# Patient Record
Sex: Female | Born: 1980 | Race: Black or African American | Hispanic: No | Smoking: Never smoker
Health system: Southern US, Community
[De-identification: ages and names within clinical notes are randomized; demographics above are authoritative.]

## PROBLEM LIST (undated history)

## (undated) DIAGNOSIS — I10 Essential (primary) hypertension: Secondary | ICD-10-CM

## (undated) DIAGNOSIS — K219 Gastro-esophageal reflux disease without esophagitis: Secondary | ICD-10-CM

## (undated) DIAGNOSIS — K8010 Calculus of gallbladder with chronic cholecystitis without obstruction: Secondary | ICD-10-CM

## (undated) DIAGNOSIS — N920 Excessive and frequent menstruation with regular cycle: Secondary | ICD-10-CM

## (undated) DIAGNOSIS — M7989 Other specified soft tissue disorders: Secondary | ICD-10-CM

## (undated) DIAGNOSIS — Z91018 Allergy to other foods: Secondary | ICD-10-CM

## (undated) DIAGNOSIS — F419 Anxiety disorder, unspecified: Secondary | ICD-10-CM

## (undated) DIAGNOSIS — M2550 Pain in unspecified joint: Secondary | ICD-10-CM

## (undated) DIAGNOSIS — R87619 Unspecified abnormal cytological findings in specimens from cervix uteri: Secondary | ICD-10-CM

## (undated) DIAGNOSIS — R51 Headache: Secondary | ICD-10-CM

## (undated) DIAGNOSIS — R519 Headache, unspecified: Secondary | ICD-10-CM

## (undated) DIAGNOSIS — R5383 Other fatigue: Secondary | ICD-10-CM

## (undated) DIAGNOSIS — T8859XA Other complications of anesthesia, initial encounter: Secondary | ICD-10-CM

## (undated) DIAGNOSIS — K5900 Constipation, unspecified: Secondary | ICD-10-CM

## (undated) DIAGNOSIS — T4145XA Adverse effect of unspecified anesthetic, initial encounter: Secondary | ICD-10-CM

## (undated) DIAGNOSIS — E559 Vitamin D deficiency, unspecified: Secondary | ICD-10-CM

## (undated) DIAGNOSIS — D696 Thrombocytopenia, unspecified: Secondary | ICD-10-CM

## (undated) DIAGNOSIS — D649 Anemia, unspecified: Secondary | ICD-10-CM

## (undated) DIAGNOSIS — M25569 Pain in unspecified knee: Secondary | ICD-10-CM

## (undated) DIAGNOSIS — J45909 Unspecified asthma, uncomplicated: Secondary | ICD-10-CM

## (undated) HISTORY — DX: Thrombocytopenia, unspecified: D69.6

## (undated) HISTORY — DX: Other fatigue: R53.83

## (undated) HISTORY — DX: Allergy to other foods: Z91.018

## (undated) HISTORY — DX: Other specified soft tissue disorders: M79.89

## (undated) HISTORY — DX: Anxiety disorder, unspecified: F41.9

## (undated) HISTORY — DX: Unspecified asthma, uncomplicated: J45.909

## (undated) HISTORY — DX: Excessive and frequent menstruation with regular cycle: N92.0

## (undated) HISTORY — PX: FOOT SURGERY: SHX648

## (undated) HISTORY — DX: Unspecified abnormal cytological findings in specimens from cervix uteri: R87.619

## (undated) HISTORY — DX: Essential (primary) hypertension: I10

## (undated) HISTORY — DX: Constipation, unspecified: K59.00

## (undated) HISTORY — DX: Pain in unspecified knee: M25.569

## (undated) HISTORY — DX: Anemia, unspecified: D64.9

## (undated) HISTORY — DX: Vitamin D deficiency, unspecified: E55.9

## (undated) HISTORY — DX: Pain in unspecified joint: M25.50

---

## 2001-06-11 HISTORY — PX: BREAST SURGERY: SHX581

## 2003-06-12 DIAGNOSIS — D696 Thrombocytopenia, unspecified: Secondary | ICD-10-CM

## 2003-06-12 HISTORY — DX: Thrombocytopenia, unspecified: D69.6

## 2008-06-11 HISTORY — PX: TUBAL LIGATION: SHX77

## 2011-07-11 ENCOUNTER — Institutional Professional Consult (permissible substitution): Payer: Self-pay | Admitting: Cardiology

## 2011-07-24 ENCOUNTER — Telehealth: Payer: Self-pay | Admitting: Hematology & Oncology

## 2011-07-24 NOTE — Telephone Encounter (Signed)
Pt aware of 2-28 appointment faxed letter to referrring

## 2011-08-09 ENCOUNTER — Ambulatory Visit: Payer: Self-pay | Admitting: Hematology & Oncology

## 2011-08-09 ENCOUNTER — Other Ambulatory Visit: Payer: Self-pay | Admitting: Lab

## 2011-08-09 ENCOUNTER — Ambulatory Visit: Payer: Self-pay

## 2011-08-09 ENCOUNTER — Telehealth: Payer: Self-pay | Admitting: Hematology & Oncology

## 2011-08-09 NOTE — Telephone Encounter (Signed)
Pt cx 2-28 reschedule for 4-1 said she had a sick child.

## 2011-09-10 ENCOUNTER — Other Ambulatory Visit: Payer: Self-pay | Admitting: Lab

## 2011-09-10 ENCOUNTER — Encounter: Payer: Self-pay | Admitting: Hematology & Oncology

## 2011-09-10 ENCOUNTER — Telehealth: Payer: Self-pay | Admitting: Hematology & Oncology

## 2011-09-10 ENCOUNTER — Ambulatory Visit: Payer: Self-pay

## 2011-09-10 NOTE — Telephone Encounter (Signed)
Amy at referring is aware pt was a no show for the 2nd time. Per Dr. Myna Hidalgo do not reschedule patient.

## 2012-10-20 ENCOUNTER — Encounter: Payer: Self-pay | Admitting: *Deleted

## 2012-10-21 ENCOUNTER — Encounter: Payer: Self-pay | Admitting: Nurse Practitioner

## 2012-10-21 ENCOUNTER — Ambulatory Visit (INDEPENDENT_AMBULATORY_CARE_PROVIDER_SITE_OTHER): Payer: BC Managed Care – PPO | Admitting: Nurse Practitioner

## 2012-10-21 VITALS — BP 130/68 | HR 76 | Resp 14 | Ht 64.5 in | Wt 245.8 lb

## 2012-10-21 DIAGNOSIS — Z0000 Encounter for general adult medical examination without abnormal findings: Secondary | ICD-10-CM

## 2012-10-21 DIAGNOSIS — Z01419 Encounter for gynecological examination (general) (routine) without abnormal findings: Secondary | ICD-10-CM

## 2012-10-21 LAB — POCT URINALYSIS DIPSTICK
Spec Grav, UA: 1.01
Urobilinogen, UA: NEGATIVE

## 2012-10-21 NOTE — Patient Instructions (Signed)

## 2012-10-21 NOTE — Progress Notes (Signed)
32 y.o. G63P4 Married African American Fe here for annual exam.  Menses have been regular except for last month. PMP 08/30/12 and then no menses unit 10/10/12 with an increase in PMS with food craving, breast tenderness and bloating.  States she has been having menstrual type pain about 2 wk's prior to cycle. Then again cramps 1 week prior to cycle.  This past cycle which was late started with watery red then bright red bleeding, but only lasted for 3 days.  Usually last 3-4 days and darker in color.  Some pelvic with sexual activity if close to cycle. Also noted with this last cycle a pelvic pressure and  'shifting'  that lasted for a week. She had PUS 06/27/11.  History of Thrombocytopenia and was to see Dr. Blenda Peals and did not show 09/2011.  Patient's last menstrual period was 10/10/2012.          Sexually active: yes  The current method of family planning is BTL.   (OCP causes increase in Headaches) Exercising: yes  cardio Smoker:  no  Health Maintenance: Pap:  05/29/2011  Normal  MMG: never TDaP:  unknown Labs: Hgb-12.9   does not have a smoking history on file. She has never used smokeless tobacco. She reports that she does not use illicit drugs.  Past Medical History  Diagnosis Date  . Menorrhagia   . Anemia   . Thrombocytopenia     Past Surgical History  Procedure Laterality Date  . Foot surgery Left     correct arch  . Breast surgery      breast reduction   . Tubal ligation Bilateral     Current Outpatient Prescriptions  Medication Sig Dispense Refill  . Multiple Vitamins-Minerals (MULTIVITAMIN PO) Take by mouth daily.      . ferrous fumarate (FERRO-SEQUELS) 50 MG CR tablet Take 50 mg by mouth daily.       No current facility-administered medications for this visit.    History reviewed. No pertinent family history.  ROS:  Pertinent items are noted in HPI.  Otherwise, a comprehensive ROS was negative.  Exam:   BP 130/68  Pulse 76  Resp 14  Ht 5' 4.5" (1.638 m)  Wt  245 lb 12.8 oz (111.494 kg)  BMI 41.56 kg/m2  LMP 10/10/2012 Height: 5' 4.5" (163.8 cm)  Ht Readings from Last 3 Encounters:  10/21/12 5' 4.5" (1.638 m)    General appearance: alert, cooperative and appears stated age Head: Normocephalic, without obvious abnormality, atraumatic Neck: no adenopathy, supple, symmetrical, trachea midline and thyroid normal to inspection and palpation Lungs: clear to auscultation bilaterally Breasts: normal appearance, no masses or tenderness Post breast reductions. Heart: regular rate and rhythm Abdomen: soft, non-tender; no masses,  no organomegaly Extremities: extremities normal, atraumatic, no cyanosis or edema Skin: Skin color, texture, turgor normal. No rashes or lesions Lymph nodes: Cervical, supraclavicular, and axillary nodes normal. No abnormal inguinal nodes palpated Neurologic: Grossly normal   Pelvic: External genitalia:  no lesions              Urethra:  normal appearing urethra with no masses, tenderness or lesions              Bartholin's and Skene's: normal                 Vagina: normal appearing vagina with normal color and discharge, no lesions              Cervix: anteverted  Pap taken: yes plus HR HPV Bimanual Exam:  Uterus:  normal size, contour, position, consistency, mobility, non-tender              Adnexa: no mass, fullness, tenderness               Rectovaginal: Confirms               Anus:  normal sphincter tone, no lesions  A:  Well Woman with normal exam  S/P BTL  History of thrombocytopenia (failed to see Onc 4/13)  History of anemia secondary to menorrhagia  P:   Pap smear as per guidelines   Will discuss her symptoms with Dr. Hyacinth Meeker who did her last PUS.  Most likely conservative management and menses record.  return annually or prn  An After Visit Summary was printed and given to the patient.  Reviewed note.  Agree with above plan.  Lum Keas, MD

## 2012-10-23 LAB — HEMOGLOBIN, FINGERSTICK: Hemoglobin, fingerstick: 12.9 g/dL (ref 12.0–16.0)

## 2012-10-23 LAB — IPS PAP TEST WITH HPV

## 2013-02-12 NOTE — Progress Notes (Signed)
This encounter was created in error - please disregard.

## 2014-04-12 ENCOUNTER — Encounter: Payer: Self-pay | Admitting: Nurse Practitioner

## 2015-01-19 ENCOUNTER — Encounter: Payer: Self-pay | Admitting: Nurse Practitioner

## 2015-05-27 ENCOUNTER — Ambulatory Visit (INDEPENDENT_AMBULATORY_CARE_PROVIDER_SITE_OTHER): Payer: BLUE CROSS/BLUE SHIELD | Admitting: Nurse Practitioner

## 2015-05-27 ENCOUNTER — Encounter: Payer: Self-pay | Admitting: Nurse Practitioner

## 2015-05-27 VITALS — BP 120/76 | HR 68 | Resp 16 | Ht 64.5 in | Wt 215.0 lb

## 2015-05-27 DIAGNOSIS — K5900 Constipation, unspecified: Secondary | ICD-10-CM | POA: Diagnosis not present

## 2015-05-27 DIAGNOSIS — Z23 Encounter for immunization: Secondary | ICD-10-CM | POA: Diagnosis not present

## 2015-05-27 DIAGNOSIS — Z0000 Encounter for general adult medical examination without abnormal findings: Secondary | ICD-10-CM | POA: Diagnosis not present

## 2015-05-27 DIAGNOSIS — Z01419 Encounter for gynecological examination (general) (routine) without abnormal findings: Secondary | ICD-10-CM | POA: Diagnosis not present

## 2015-05-27 DIAGNOSIS — N898 Other specified noninflammatory disorders of vagina: Secondary | ICD-10-CM

## 2015-05-27 DIAGNOSIS — Z113 Encounter for screening for infections with a predominantly sexual mode of transmission: Secondary | ICD-10-CM | POA: Diagnosis not present

## 2015-05-27 DIAGNOSIS — E559 Vitamin D deficiency, unspecified: Secondary | ICD-10-CM

## 2015-05-27 DIAGNOSIS — R87619 Unspecified abnormal cytological findings in specimens from cervix uteri: Secondary | ICD-10-CM

## 2015-05-27 NOTE — Patient Instructions (Signed)

## 2015-05-27 NOTE — Progress Notes (Signed)
34 y.o. G64P4 Married  African American Fe here for annual exam.  Menses now at 3 -4 days.  Heavy for 2 days.  Changing every few hours.  Seems  to occur every other month but exercise makes it more tolerable.  Some cramps since BTL.  Last PUS about 2013 showing small myomas and normal endo biopsy.  She is having more problems with constipation despite using Miralax prn.  Patient's last menstrual period was 05/21/2015 (exact date).          Sexually active: Yes.    The current method of family planning is tubal ligation.    Exercising: Yes.    Walking Smoker:  no  Health Maintenance: Pap: 10/21/2012 Neg HR HPV Neg TDaP:  Not sure  Labs: will get here   reports that she has never smoked. She has never used smokeless tobacco. She reports that she drinks alcohol. She reports that she does not use illicit drugs.  Past Medical History  Diagnosis Date  . Menorrhagia   . Anemia   . Thrombocytopenia (HCC) 2005    Past Surgical History  Procedure Laterality Date  . Foot surgery Left child 4th grade    correct arch  . Tubal ligation Bilateral 2010  . Breast surgery  2003    breast reduction     Current Outpatient Prescriptions  Medication Sig Dispense Refill  . ferrous fumarate (FERRO-SEQUELS) 50 MG CR tablet Take 50 mg by mouth daily.    . Multiple Vitamins-Minerals (MULTIVITAMIN PO) Take by mouth daily.     No current facility-administered medications for this visit.    Family History  Problem Relation Age of Onset  . Hyperlipidemia Mother   . Hypertension Mother   . Diabetes Mother   . Pancreatic cancer Father   . Diverticulitis Mother     ROS:  Pertinent items are noted in HPI.  Otherwise, a comprehensive ROS was negative.  Exam:   BP 120/76 mmHg  Pulse 68  Resp 16  Ht 5' 4.5" (1.638 m)  Wt 215 lb (97.523 kg)  BMI 36.35 kg/m2  LMP 05/21/2015 (Exact Date) Height: 5' 4.5" (163.8 cm) Ht Readings from Last 3 Encounters:  05/27/15 5' 4.5" (1.638 m)  10/21/12 5' 4.5"  (1.638 m)    General appearance: alert, cooperative and appears stated age Head: Normocephalic, without obvious abnormality, atraumatic Neck: no adenopathy, supple, symmetrical, trachea midline and thyroid normal to inspection and palpation Lungs: clear to auscultation bilaterally Breasts: normal appearance, no masses or tenderness Heart: regular rate and rhythm Abdomen: soft, non-tender; no masses,  no organomegaly Extremities: extremities normal, atraumatic, no cyanosis or edema Skin: Skin color, texture, turgor normal. No rashes or lesions Lymph nodes: Cervical, supraclavicular, and axillary nodes normal. No abnormal inguinal nodes palpated Neurologic: Grossly normal   Pelvic: External genitalia:  no lesions              Urethra:  normal appearing urethra with no masses, tenderness or lesions              Bartholin's and Skene's: normal                 Vagina: normal appearing vagina with normal color and some brown tint vaginal discharge that may be from recent menses.  No lesions              Cervix: anteverted              Pap taken: Yes.   Bimanual Exam:  Uterus:  normal size, contour, position, consistency, mobility, non-tender              Adnexa: no mass, fullness, tenderness               Rectovaginal: Confirms               Anus:  normal sphincter tone, no lesions  Chaperone present: yes  A:  Well Woman with normal exam  S/P BTL 2010 with some menorrhagia History of thrombocytopenia (failed to see Onc 4/13) History of anemia secondary to menorrhagia  Immunization update  Chronic constipation - wants to see GI  R/O vaginitis   P:   Reviewed health and wellness pertinent to exam  Pap smear as above  Will get labs and follow - she is currently off Shannan HarperFerro Sequel  Talked about that she may need to see Oncologist again  Will  update TDaP  Counseled on breast self exam, mammography screening, adequate intake of calcium and vitamin D, diet and  exercise return annually or prn  An After Visit Summary was printed and given to the patient.

## 2015-05-28 LAB — CBC WITH DIFFERENTIAL/PLATELET
BASOS ABS: 0 10*3/uL (ref 0.0–0.1)
Basophils Relative: 0 % (ref 0–1)
Eosinophils Absolute: 0.1 10*3/uL (ref 0.0–0.7)
Eosinophils Relative: 2 % (ref 0–5)
HEMATOCRIT: 36.7 % (ref 36.0–46.0)
Hemoglobin: 12.7 g/dL (ref 12.0–15.0)
LYMPHS ABS: 1.5 10*3/uL (ref 0.7–4.0)
LYMPHS PCT: 33 % (ref 12–46)
MCH: 30.8 pg (ref 26.0–34.0)
MCHC: 34.6 g/dL (ref 30.0–36.0)
MCV: 89.1 fL (ref 78.0–100.0)
MPV: 11.9 fL (ref 8.6–12.4)
Monocytes Absolute: 0.4 10*3/uL (ref 0.1–1.0)
Monocytes Relative: 8 % (ref 3–12)
NEUTROS ABS: 2.6 10*3/uL (ref 1.7–7.7)
NEUTROS PCT: 57 % (ref 43–77)
Platelets: 173 10*3/uL (ref 150–400)
RBC: 4.12 MIL/uL (ref 3.87–5.11)
RDW: 13.8 % (ref 11.5–15.5)
WBC: 4.5 10*3/uL (ref 4.0–10.5)

## 2015-05-28 LAB — COMPREHENSIVE METABOLIC PANEL
ALK PHOS: 43 U/L (ref 33–115)
ALT: 13 U/L (ref 6–29)
AST: 20 U/L (ref 10–30)
Albumin: 4 g/dL (ref 3.6–5.1)
BILIRUBIN TOTAL: 0.4 mg/dL (ref 0.2–1.2)
BUN: 12 mg/dL (ref 7–25)
CO2: 28 mmol/L (ref 20–31)
Calcium: 8.8 mg/dL (ref 8.6–10.2)
Chloride: 102 mmol/L (ref 98–110)
Creat: 0.97 mg/dL (ref 0.50–1.10)
GLUCOSE: 85 mg/dL (ref 65–99)
POTASSIUM: 3.8 mmol/L (ref 3.5–5.3)
Sodium: 141 mmol/L (ref 135–146)
Total Protein: 6.6 g/dL (ref 6.1–8.1)

## 2015-05-28 LAB — STD PANEL
HEP B S AG: NEGATIVE
HIV: NONREACTIVE

## 2015-05-28 LAB — WET PREP BY MOLECULAR PROBE
CANDIDA SPECIES: NEGATIVE
Gardnerella vaginalis: POSITIVE — AB
Trichomonas vaginosis: NEGATIVE

## 2015-05-28 LAB — VITAMIN D 25 HYDROXY (VIT D DEFICIENCY, FRACTURES): Vit D, 25-Hydroxy: 33 ng/mL (ref 30–100)

## 2015-05-30 ENCOUNTER — Other Ambulatory Visit: Payer: Self-pay | Admitting: Nurse Practitioner

## 2015-05-30 MED ORDER — METRONIDAZOLE 0.75 % VA GEL: 1.0000 | Freq: Every day | VAGINAL | Status: DC

## 2015-05-30 NOTE — Progress Notes (Signed)
Encounter reviewed Cynde Menard, MD   

## 2015-05-31 LAB — IPS N GONORRHOEA AND CHLAMYDIA BY PCR

## 2015-06-01 LAB — IPS PAP TEST WITH HPV

## 2015-06-01 NOTE — Addendum Note (Signed)
Addended by: Ria CommentGRUBB, Makaveli Hoard R on: 06/01/2015 01:22 PM   Modules accepted: Orders

## 2015-06-03 LAB — IPS HPV GENOTYPING 16/18

## 2015-06-07 ENCOUNTER — Telehealth: Payer: Self-pay | Admitting: Emergency Medicine

## 2015-06-07 NOTE — Telephone Encounter (Signed)
-----   Message from Ria CommentPatricia Grubb, FNP sent at 06/05/2015  9:42 PM EST ----- Please make pt aware of normal pap with + HR HPV.  The genotype for # 16 & 18 was negative.  It is important for her to get a repeat pap in a year. 08 recall.  The BV found on pap has already been treated along with other labs reviewed.

## 2015-06-08 NOTE — Telephone Encounter (Signed)
Patient made aware of results of normal pap smear with positive HR HPV, but negative 16/18 subtype testing. Discussed HPV infection/transmission and prevention and plan of care going forward for patient. Patient voices clear understanding of results.  She is offered consult appointment with provider and she declines at this time.  She verbalized understanding of importance of follow up pap smear in one year.  08 Recall entered. Patient does not have annual exam scheduled.  Routing to provider for final review. Patient agreeable to disposition. Will close encounter.

## 2015-06-10 ENCOUNTER — Other Ambulatory Visit: Payer: Self-pay | Admitting: Gastroenterology

## 2015-06-10 DIAGNOSIS — R1013 Epigastric pain: Secondary | ICD-10-CM

## 2015-06-10 DIAGNOSIS — R110 Nausea: Secondary | ICD-10-CM

## 2015-07-04 ENCOUNTER — Encounter (HOSPITAL_COMMUNITY): Payer: BLUE CROSS/BLUE SHIELD

## 2015-07-04 ENCOUNTER — Ambulatory Visit (HOSPITAL_COMMUNITY)
Admission: RE | Admit: 2015-07-04 | Discharge: 2015-07-04 | Disposition: A | Discharge status: Home / Self Care | Payer: BLUE CROSS/BLUE SHIELD | Source: Ambulatory Visit | Attending: Gastroenterology | Admitting: Gastroenterology

## 2015-07-04 DIAGNOSIS — R1013 Epigastric pain: Secondary | ICD-10-CM | POA: Insufficient documentation

## 2015-07-04 DIAGNOSIS — R110 Nausea: Secondary | ICD-10-CM | POA: Diagnosis not present

## 2015-07-04 DIAGNOSIS — K8020 Calculus of gallbladder without cholecystitis without obstruction: Secondary | ICD-10-CM | POA: Insufficient documentation

## 2015-07-25 ENCOUNTER — Ambulatory Visit: Payer: Self-pay | Admitting: Surgery

## 2015-07-25 NOTE — H&P (Signed)
History of Present Illness Julia Schultz. Julia Strebel MD; 07/25/2015 12:39 PM) Patient words: GB.  The patient is a 35 year old female who presents for evaluation of gall stones. Referred by Dr. Charna Schultz for gallbladder disease  This is a 35 year old female who presents with several years of intermittent digestive symptoms. She reports frequent constipation. She also reports postprandial bloating, cramping, as well as nausea. She developed abdominal pain in her epigastrium and right upper quadrant. When the symptoms get worse the pain spreads across to her left. Her family has noticed that this happens every time she tries to eat. She recently saw Dr. Loreta Schultz for constipation. Ultrasound and labs were obtained. Liver function tests were normal. Ultrasound showed multiple gallstones  CLINICAL DATA: Nausea, epigastric abdominal pain for 5 years  EXAM: ABDOMEN ULTRASOUND COMPLETE  COMPARISON: None  FINDINGS: Gallbladder: Multiple mobile shadowing calculi within gallbladder up to 13 mm diameter. Normal gallbladder wall thickness. No pericholecystic fluid or sonographic Murphy sign.  Common bile duct: Diameter: Normal caliber 3 mm diameter.  Liver: Normal appearance  IVC: Normal appearance  Pancreas: Normal appearance  Spleen: Normal appearance, 6.4 cm length  Right Kidney: Length: 10.3 cm. Normal morphology without mass or hydronephrosis.  Left Kidney: Length: 11.9 cm. Normal morphology without mass or hydronephrosis.  Abdominal aorta: Normal caliber  Other findings: No free fluid  IMPRESSION: Cholelithiasis without evidence acute cholecystitis or biliary obstruction.   Electronically Signed By: Julia Schultz M.D. On: 07/04/2015 09:18     Other Problems (Julia Eversole, LPN; 0/98/1191 47:82 AM) Cholelithiasis Hemorrhoids  Past Surgical History (Julia Eversole, LPN; 9/56/2130 86:57 AM) Cesarean Section - Multiple Foot Surgery Left. Mammoplasty; Reduction  Bilateral.  Diagnostic Studies History (Julia Eversole, LPN; 8/46/9629 52:84 AM) Colonoscopy never Mammogram never Pap Smear 1-5 years ago  Allergies (Julia Eversole, LPN; 1/32/4401 02:72 AM) No Known Drug Allergies 07/25/2015  Medication History (Julia Eversole, LPN; 5/36/6440 34:74 AM) Multiple Vitamin (Oral) Active. Medications Reconciled  Social History (Julia Eversole, LPN; 2/59/5638 75:64 AM) Alcohol use Occasional alcohol use. Caffeine use Coffee, Tea. No drug use Tobacco use Never smoker.  Family History Julia Pilling, LPN; 3/32/9518 84:16 AM) Bleeding disorder Mother. Diabetes Mellitus Mother. Hypertension Father, Mother. Ischemic Bowel Disease Mother. Malignant Neoplasm Of Pancreas Father.  Pregnancy / Birth History Julia Pilling, LPN; 11/15/3014 01:09 AM) Julia Schultz 4 Maternal age 2-20 Para 4 Regular periods     Review of Systems (Julia Eversole LPN; 09/01/5571 22:02 AM) General Present- Fatigue. Not Present- Appetite Loss, Chills, Fever, Night Sweats, Weight Gain and Weight Loss. Skin Not Present- Change in Wart/Mole, Dryness, Hives, Jaundice, New Lesions, Non-Healing Wounds, Rash and Ulcer. HEENT Present- Seasonal Allergies and Wears glasses/contact lenses. Not Present- Earache, Hearing Loss, Hoarseness, Nose Bleed, Oral Ulcers, Ringing in the Ears, Sinus Pain, Sore Throat, Visual Disturbances and Yellow Eyes. Respiratory Present- Snoring. Not Present- Bloody sputum, Chronic Cough, Difficulty Breathing and Wheezing. Breast Not Present- Breast Mass, Breast Pain, Nipple Discharge and Skin Changes. Cardiovascular Present- Leg Cramps. Not Present- Chest Pain, Difficulty Breathing Lying Down, Palpitations, Rapid Heart Rate, Shortness of Breath and Swelling of Extremities. Gastrointestinal Present- Abdominal Pain, Bloating, Constipation, Gets full quickly at meals, Hemorrhoids, Indigestion and Nausea. Not Present- Bloody Stool, Change in Bowel  Habits, Chronic diarrhea, Difficulty Swallowing, Excessive gas, Rectal Pain and Vomiting. Female Genitourinary Not Present- Frequency, Nocturia, Painful Urination, Pelvic Pain and Urgency. Musculoskeletal Not Present- Back Pain, Joint Pain, Joint Stiffness, Muscle Pain, Muscle Weakness and Swelling of Extremities. Neurological Not Present- Decreased Memory, Fainting, Headaches,  Numbness, Seizures, Tingling, Tremor, Trouble walking and Weakness. Psychiatric Not Present- Anxiety, Bipolar, Change in Sleep Pattern, Depression, Fearful and Frequent crying. Endocrine Not Present- Cold Intolerance, Excessive Hunger, Hair Changes, Heat Intolerance, Hot flashes and New Diabetes. Hematology Present- Easy Bruising and Excessive bleeding. Not Present- Gland problems, HIV and Persistent Infections.  Vitals (Julia Eversole LPN; 1/61/0960 45:40 AM) 07/25/2015 10:04 AM Weight: 215.2 lb Height: 63in Body Surface Area: 1.99 m Body Mass Index: 38.12 kg/m  Temp.: 98.89F(Oral)  Pulse: 70 (Regular)  BP: 116/70 (Sitting, Left Arm, Standard)      Physical Exam Julia Hazard K. Chrishelle Zito MD; 07/25/2015 12:40 PM)  The physical exam findings are as follows: Note:WDWN in NAD HEENT: EOMI, sclera anicteric Neck: No masses, no thyromegaly Lungs: CTA bilaterally; normal respiratory effort CV: Regular rate and rhythm; no murmurs Abd: +bowel sounds, soft, mildly tender in RUQ and epigastrium; no palpable masses Ext: Well-perfused; no edema Skin: Warm, dry; no sign of jaundice    Assessment & Plan Julia Hazard K. Shwanda Soltis MD; 07/25/2015 10:40 AM)  CHRONIC CHOLECYSTITIS WITH CALCULUS (K80.10)  Current Plans Schedule for Surgery - Laparoscopic cholecystectomy with intraoperative cholangiogram. The surgical procedure has been discussed with the patient. Potential risks, benefits, alternative treatments, and expected outcomes have been explained. All of the patient's questions at this time have been answered. The  likelihood of reaching the patient's treatment goal is good. The patient understand the proposed surgical procedure and wishes to proceed. Pt Education - Pamphlet Given - Laparoscopic Gallbladder Surgery: discussed with patient and provided information.  Julia Schultz. [name], MD, [facility]  General/ Trauma Surgery  07/25/2015 12:41 PM

## 2015-08-05 NOTE — Pre-Procedure Instructions (Signed)
Julia Schultz  08/05/2015      [facility] [address], [place] - [address] AT [facility] [address]) [address] [place] [address] Phone: [phone] Fax: [phone]    Your procedure is scheduled on Wednesday, August 10, 2015  Report to [facility] Admitting at 6:30 A.M.  Call this number if you have problems the morning of surgery:  [phone]   Remember:  Do not eat food or drink liquids after midnight Tuesday, August 09, 2015  Take these medicines the morning of surgery with A SIP OF WATER : None  Stop taking Aspirin, vitamins, fish oil, pseudoephedrine (SUDAFED),  and herbal medications. Do not take any NSAIDs ie: Ibuprofen, Advil, Naproxen or any medication containing Aspirin; stop now.   Do not wear jewelry, make-up or nail polish.  Do not wear lotions, powders, or perfumes.  You may not wear deodorant.  Do not shave 48 hours prior to surgery.    Do not bring valuables to the hospital.  [facility] is not responsible for any belongings or valuables.  Contacts, dentures or bridgework may not be worn into surgery.  Leave your suitcase in the car.  After surgery it may be brought to your room.  For patients admitted to the hospital, discharge time will be determined by your treatment team.  Patients discharged the day of surgery will not be allowed to drive home.   Name and phone number of your driver:   Special instructions: [facility] - Preparing for Surgery  Before surgery, you can play an important role.  Because skin is not sterile, your skin needs to be as free of germs as possible.  You can reduce the number of germs on you skin by washing with CHG (chlorahexidine gluconate) soap before surgery.  CHG is an antiseptic cleaner which kills germs and bonds with the skin to continue killing germs even after washing.  Please DO NOT use if you have an allergy to CHG or antibacterial soaps.  If  your skin becomes reddened/irritated stop using the CHG and inform your nurse when you arrive at Short Stay.  Do not shave (including legs and underarms) for at least 48 hours prior to the first CHG shower.  You may shave your face.  Please follow these instructions carefully:   1.  Shower with CHG Soap the night before surgery and the morning of Surgery.  2.  If you choose to wash your hair, wash your hair first as usual with your normal shampoo.  3.  After you shampoo, rinse your hair and body thoroughly to remove the Shampoo.  4.  Use CHG as you would any other liquid soap.  You can apply chg directly  to the skin and wash gently with scrungie or a clean washcloth.  5.  Apply the CHG Soap to your body ONLY FROM THE NECK DOWN.  Do not use on open wounds or open sores.  Avoid contact with your eyes, ears, mouth and genitals (private parts).  Wash genitals (private parts) with your normal soap.  6.  Wash thoroughly, paying special attention to the area where your surgery will be performed.  7.  Thoroughly rinse your body with warm water from the neck down.  8.  DO NOT shower/wash with your normal soap after using and rinsing off the CHG Soap.  9.  Pat yourself dry with a clean towel.  10.  Wear clean pajamas.            11.  Place clean sheets on your bed the night of your first shower and do not sleep with pets.  Day of Surgery  Do not apply any lotions/deodorants the morning of surgery.  Please wear clean clothes to the hospital/surgery center.  Please read over the following fact sheets that you were given. Pain Booklet, Coughing and Deep Breathing and Surgical Site Infection Prevention

## 2015-08-08 ENCOUNTER — Encounter (HOSPITAL_COMMUNITY)
Admission: RE | Admit: 2015-08-08 | Discharge: 2015-08-08 | Disposition: A | Discharge status: Home / Self Care | Payer: BLUE CROSS/BLUE SHIELD | Source: Ambulatory Visit | Attending: Surgery | Admitting: Surgery

## 2015-08-08 ENCOUNTER — Encounter (HOSPITAL_COMMUNITY): Payer: Self-pay

## 2015-08-08 DIAGNOSIS — K8010 Calculus of gallbladder with chronic cholecystitis without obstruction: Secondary | ICD-10-CM | POA: Diagnosis not present

## 2015-08-08 HISTORY — DX: Headache: R51

## 2015-08-08 HISTORY — DX: Other complications of anesthesia, initial encounter: T88.59XA

## 2015-08-08 HISTORY — DX: Gastro-esophageal reflux disease without esophagitis: K21.9

## 2015-08-08 HISTORY — DX: Calculus of gallbladder with chronic cholecystitis without obstruction: K80.10

## 2015-08-08 HISTORY — DX: Headache, unspecified: R51.9

## 2015-08-08 HISTORY — DX: Adverse effect of unspecified anesthetic, initial encounter: T41.45XA

## 2015-08-08 LAB — CBC
HCT: 39.5 % (ref 36.0–46.0)
HEMOGLOBIN: 13 g/dL (ref 12.0–15.0)
MCH: 30 pg (ref 26.0–34.0)
MCHC: 32.9 g/dL (ref 30.0–36.0)
MCV: 91 fL (ref 78.0–100.0)
Platelets: 141 10*3/uL — ABNORMAL LOW (ref 150–400)
RBC: 4.34 MIL/uL (ref 3.87–5.11)
RDW: 12.8 % (ref 11.5–15.5)
WBC: 3.9 10*3/uL — AB (ref 4.0–10.5)

## 2015-08-08 LAB — HCG, SERUM, QUALITATIVE: PREG SERUM: NEGATIVE

## 2015-08-08 NOTE — Progress Notes (Signed)
Pt denies SOB, chest pain, and being under the care of a cardiologist. Pt denies having a stress test, echo and cardiac cath. Pt denies having a chest x ray and EKG within the last year. 

## 2015-08-09 MED ORDER — DEXTROSE 5 % IV SOLN
3.0000 g | INTRAVENOUS | Status: AC
  Administered 2015-08-10: 3 g via INTRAVENOUS
  Filled 2015-08-09: qty 3000

## 2015-08-10 ENCOUNTER — Encounter (HOSPITAL_COMMUNITY): Payer: Self-pay | Admitting: *Deleted

## 2015-08-10 ENCOUNTER — Ambulatory Visit (HOSPITAL_COMMUNITY): Payer: BLUE CROSS/BLUE SHIELD

## 2015-08-10 ENCOUNTER — Ambulatory Visit (HOSPITAL_COMMUNITY): Payer: BLUE CROSS/BLUE SHIELD | Admitting: Anesthesiology

## 2015-08-10 ENCOUNTER — Ambulatory Visit (HOSPITAL_COMMUNITY)
Admission: RE | Admit: 2015-08-10 | Discharge: 2015-08-10 | Disposition: A | Discharge status: Home / Self Care | Payer: BLUE CROSS/BLUE SHIELD | Source: Ambulatory Visit | Attending: Surgery | Admitting: Surgery

## 2015-08-10 ENCOUNTER — Encounter (HOSPITAL_COMMUNITY)
Admission: RE | Disposition: A | Discharge status: Home / Self Care | Payer: Self-pay | Source: Ambulatory Visit | Attending: Surgery

## 2015-08-10 DIAGNOSIS — K8010 Calculus of gallbladder with chronic cholecystitis without obstruction: Secondary | ICD-10-CM | POA: Diagnosis not present

## 2015-08-10 DIAGNOSIS — K829 Disease of gallbladder, unspecified: Secondary | ICD-10-CM

## 2015-08-10 HISTORY — PX: CHOLECYSTECTOMY: SHX55

## 2015-08-10 SURGERY — LAPAROSCOPIC CHOLECYSTECTOMY WITH INTRAOPERATIVE CHOLANGIOGRAM
Anesthesia: General | Site: Abdomen

## 2015-08-10 MED ORDER — LIDOCAINE HCL (CARDIAC) 20 MG/ML IV SOLN
INTRAVENOUS | Status: DC | PRN
  Administered 2015-08-10: 60 mg via INTRAVENOUS

## 2015-08-10 MED ORDER — PROMETHAZINE HCL 25 MG/ML IJ SOLN: 6.2500 mg | INTRAMUSCULAR | Status: DC | PRN

## 2015-08-10 MED ORDER — FENTANYL CITRATE (PF) 250 MCG/5ML IJ SOLN
INTRAMUSCULAR | Status: AC
  Filled 2015-08-10: qty 5

## 2015-08-10 MED ORDER — FENTANYL CITRATE (PF) 100 MCG/2ML IJ SOLN
INTRAMUSCULAR | Status: DC | PRN
  Administered 2015-08-10: 150 ug via INTRAVENOUS
  Administered 2015-08-10 (×2): 50 ug via INTRAVENOUS

## 2015-08-10 MED ORDER — 0.9 % SODIUM CHLORIDE (POUR BTL) OPTIME
TOPICAL | Status: DC | PRN
  Administered 2015-08-10: 1000 mL

## 2015-08-10 MED ORDER — OXYCODONE-ACETAMINOPHEN 5-325 MG PO TABS
1.0000 | ORAL_TABLET | ORAL | Status: DC | PRN
  Administered 2015-08-10: 1 via ORAL

## 2015-08-10 MED ORDER — DEXAMETHASONE SODIUM PHOSPHATE 4 MG/ML IJ SOLN
INTRAMUSCULAR | Status: DC | PRN
  Administered 2015-08-10: 8 mg via INTRAVENOUS

## 2015-08-10 MED ORDER — SODIUM CHLORIDE 0.9 % IR SOLN
Status: DC | PRN
  Administered 2015-08-10: 1000 mL

## 2015-08-10 MED ORDER — HYDROMORPHONE HCL 1 MG/ML IJ SOLN
0.2500 mg | INTRAMUSCULAR | Status: DC | PRN
  Administered 2015-08-10 (×2): 0.5 mg via INTRAVENOUS

## 2015-08-10 MED ORDER — MORPHINE SULFATE (PF) 2 MG/ML IV SOLN: 2.0000 mg | INTRAVENOUS | Status: DC | PRN

## 2015-08-10 MED ORDER — PROPOFOL 10 MG/ML IV BOLUS
INTRAVENOUS | Status: AC
  Filled 2015-08-10: qty 20

## 2015-08-10 MED ORDER — OXYCODONE-ACETAMINOPHEN 5-325 MG PO TABS: 1.0000 | ORAL_TABLET | ORAL | Status: DC | PRN

## 2015-08-10 MED ORDER — DEXAMETHASONE SODIUM PHOSPHATE 4 MG/ML IJ SOLN
INTRAMUSCULAR | Status: AC
  Filled 2015-08-10: qty 2

## 2015-08-10 MED ORDER — CHLORHEXIDINE GLUCONATE 4 % EX LIQD: 1.0000 "application " | Freq: Once | CUTANEOUS | Status: DC

## 2015-08-10 MED ORDER — ONDANSETRON HCL 4 MG/2ML IJ SOLN: 4.0000 mg | INTRAMUSCULAR | Status: DC | PRN

## 2015-08-10 MED ORDER — OXYCODONE-ACETAMINOPHEN 5-325 MG PO TABS
ORAL_TABLET | ORAL | Status: AC
  Administered 2015-08-10: 1 via ORAL
  Filled 2015-08-10: qty 1

## 2015-08-10 MED ORDER — MIDAZOLAM HCL 2 MG/2ML IJ SOLN
0.5000 mg | INTRAMUSCULAR | Status: DC | PRN
  Administered 2015-08-10: 0.5 mg via INTRAVENOUS

## 2015-08-10 MED ORDER — PROPOFOL 10 MG/ML IV BOLUS
INTRAVENOUS | Status: DC | PRN
  Administered 2015-08-10: 150 mg via INTRAVENOUS

## 2015-08-10 MED ORDER — IOHEXOL 300 MG/ML  SOLN
INTRAMUSCULAR | Status: DC | PRN
  Administered 2015-08-10: 9 mL

## 2015-08-10 MED ORDER — MIDAZOLAM HCL 2 MG/2ML IJ SOLN
INTRAMUSCULAR | Status: AC
  Filled 2015-08-10: qty 2

## 2015-08-10 MED ORDER — HYDROMORPHONE HCL 1 MG/ML IJ SOLN
INTRAMUSCULAR | Status: AC
  Filled 2015-08-10: qty 1

## 2015-08-10 MED ORDER — ROCURONIUM BROMIDE 100 MG/10ML IV SOLN
INTRAVENOUS | Status: DC | PRN
  Administered 2015-08-10: 40 mg via INTRAVENOUS

## 2015-08-10 MED ORDER — GLYCOPYRROLATE 0.2 MG/ML IJ SOLN
INTRAMUSCULAR | Status: DC | PRN
  Administered 2015-08-10: .8 mg via INTRAVENOUS

## 2015-08-10 MED ORDER — MIDAZOLAM HCL 5 MG/5ML IJ SOLN
INTRAMUSCULAR | Status: DC | PRN
  Administered 2015-08-10: 2 mg via INTRAVENOUS

## 2015-08-10 MED ORDER — NEOSTIGMINE METHYLSULFATE 10 MG/10ML IV SOLN
INTRAVENOUS | Status: DC | PRN
  Administered 2015-08-10: 5 mg via INTRAVENOUS

## 2015-08-10 MED ORDER — BUPIVACAINE-EPINEPHRINE 0.25% -1:200000 IJ SOLN
INTRAMUSCULAR | Status: DC | PRN
  Administered 2015-08-10: 10 mL

## 2015-08-10 MED ORDER — ONDANSETRON HCL 4 MG/2ML IJ SOLN
INTRAMUSCULAR | Status: AC
  Filled 2015-08-10: qty 2

## 2015-08-10 MED ORDER — ROCURONIUM BROMIDE 50 MG/5ML IV SOLN
INTRAVENOUS | Status: AC
  Filled 2015-08-10: qty 1

## 2015-08-10 MED ORDER — LACTATED RINGERS IV SOLN
INTRAVENOUS | Status: DC | PRN
  Administered 2015-08-10 (×2): via INTRAVENOUS

## 2015-08-10 MED ORDER — BUPIVACAINE-EPINEPHRINE (PF) 0.25% -1:200000 IJ SOLN
INTRAMUSCULAR | Status: AC
  Filled 2015-08-10: qty 30

## 2015-08-10 MED ORDER — LIDOCAINE HCL (CARDIAC) 20 MG/ML IV SOLN
INTRAVENOUS | Status: AC
  Filled 2015-08-10: qty 5

## 2015-08-10 MED ORDER — ONDANSETRON HCL 4 MG/2ML IJ SOLN
INTRAMUSCULAR | Status: DC | PRN
  Administered 2015-08-10: 4 mg via INTRAVENOUS

## 2015-08-10 SURGICAL SUPPLY — 42 items
APPLIER CLIP ROT 10 11.4 M/L (STAPLE) ×3
BENZOIN TINCTURE PRP APPL 2/3 (GAUZE/BANDAGES/DRESSINGS) ×3 IMPLANT
CANISTER SUCTION 2500CC (MISCELLANEOUS) ×3 IMPLANT
CHLORAPREP W/TINT 26ML (MISCELLANEOUS) ×3 IMPLANT
CLIP APPLIE ROT 10 11.4 M/L (STAPLE) ×1 IMPLANT
CLOSURE WOUND 1/2 X4 (GAUZE/BANDAGES/DRESSINGS) ×1
CONT SPEC 4OZ CLIKSEAL STRL BL (MISCELLANEOUS) ×3 IMPLANT
COVER MAYO STAND STRL (DRAPES) ×3 IMPLANT
COVER SURGICAL LIGHT HANDLE (MISCELLANEOUS) ×3 IMPLANT
DRAPE C-ARM 42X72 X-RAY (DRAPES) ×3 IMPLANT
DRSG TEGADERM 2-3/8X2-3/4 SM (GAUZE/BANDAGES/DRESSINGS) ×9 IMPLANT
DRSG TEGADERM 4X4.75 (GAUZE/BANDAGES/DRESSINGS) ×3 IMPLANT
ELECT REM PT RETURN 9FT ADLT (ELECTROSURGICAL) ×3
ELECTRODE REM PT RTRN 9FT ADLT (ELECTROSURGICAL) ×1 IMPLANT
GAUZE SPONGE 2X2 8PLY STRL LF (GAUZE/BANDAGES/DRESSINGS) ×1 IMPLANT
GLOVE BIOGEL PI IND STRL 6.5 (GLOVE) ×1 IMPLANT
GLOVE BIOGEL PI IND STRL 7.0 (GLOVE) ×1 IMPLANT
GLOVE BIOGEL PI IND STRL 7.5 (GLOVE) ×1 IMPLANT
GLOVE BIOGEL PI INDICATOR 6.5 (GLOVE) ×2
GLOVE BIOGEL PI INDICATOR 7.0 (GLOVE) ×2
GLOVE BIOGEL PI INDICATOR 7.5 (GLOVE) ×2
GLOVE SURG SS PI 7.0 STRL IVOR (GLOVE) ×9 IMPLANT
GOWN STRL REUS W/ TWL LRG LVL3 (GOWN DISPOSABLE) ×3 IMPLANT
GOWN STRL REUS W/TWL LRG LVL3 (GOWN DISPOSABLE) ×6
KIT BASIN OR (CUSTOM PROCEDURE TRAY) ×3 IMPLANT
KIT ROOM TURNOVER OR (KITS) ×3 IMPLANT
NS IRRIG 1000ML POUR BTL (IV SOLUTION) ×3 IMPLANT
PAD ARMBOARD 7.5X6 YLW CONV (MISCELLANEOUS) ×3 IMPLANT
POUCH SPECIMEN RETRIEVAL 10MM (ENDOMECHANICALS) ×3 IMPLANT
SCISSORS LAP 5X35 DISP (ENDOMECHANICALS) ×3 IMPLANT
SET CHOLANGIOGRAPH 5 50 .035 (SET/KITS/TRAYS/PACK) ×3 IMPLANT
SET IRRIG TUBING LAPAROSCOPIC (IRRIGATION / IRRIGATOR) ×3 IMPLANT
SLEEVE ENDOPATH XCEL 5M (ENDOMECHANICALS) ×3 IMPLANT
SPONGE GAUZE 2X2 STER 10/PKG (GAUZE/BANDAGES/DRESSINGS) ×2
STRIP CLOSURE SKIN 1/2X4 (GAUZE/BANDAGES/DRESSINGS) ×2 IMPLANT
SUT MNCRL AB 4-0 PS2 18 (SUTURE) ×3 IMPLANT
TOWEL OR 17X26 10 PK STRL BLUE (TOWEL DISPOSABLE) ×3 IMPLANT
TRAY LAPAROSCOPIC MC (CUSTOM PROCEDURE TRAY) ×3 IMPLANT
TROCAR XCEL BLUNT TIP 100MML (ENDOMECHANICALS) ×3 IMPLANT
TROCAR XCEL NON-BLD 11X100MML (ENDOMECHANICALS) ×3 IMPLANT
TROCAR XCEL NON-BLD 5MMX100MML (ENDOMECHANICALS) ×3 IMPLANT
TUBING INSUFFLATION (TUBING) ×3 IMPLANT

## 2015-08-10 NOTE — Discharge Instructions (Signed)
[facility] °LAPAROSCOPIC SURGERY: POST OP INSTRUCTIONS °Always review your discharge instruction sheet given to you by the facility where your surgery was performed. °IF YOU HAVE DISABILITY OR FAMILY LEAVE FORMS, YOU MUST BRING THEM TO THE OFFICE FOR PROCESSING.   °DO NOT GIVE THEM TO YOUR DOCTOR. ° °1. A prescription for pain medication will be given to you upon discharge.  Take your pain medication as prescribed, if needed.  If narcotic pain medicine is not needed, then you may take acetaminophen (Tylenol) or ibuprofen (Advil) as needed. °2. Take your usually prescribed medications unless otherwise directed. °3. If you need a refill on your pain medication, please contact your pharmacy.  They will contact our office to request authorization. Prescriptions will not be filled after 5pm or on week-ends. °4. You should follow a light diet the first few days after arrival home, such as soup and crackers, etc.  Be sure to include lots of fluids daily. °5. Most patients will experience some swelling and bruising in the area of the incisions.  Ice packs will help.  Swelling and bruising can take several days to resolve.  °6. It is common to experience some constipation if taking pain medication after surgery.  Increasing fluid intake and taking a stool softener (such as Colace) will usually help or prevent this problem from occurring.  A mild laxative (Milk of Magnesia or Miralax) should be taken according to package instructions if there are no bowel movements after 48 hours. °7. Unless discharge instructions indicate otherwise, you may remove your bandages 48 hours after surgery, and you may shower at that time.  You will have steri-strips (small skin tapes) in place directly over the incision.  These strips should be left on the skin for 7-10 days.  If your surgeon used skin glue on the incision, you may shower in 24 hours.  The glue will flake off over the next 2-3 weeks.  Any sutures or staples  will be removed at the office during your follow-up visit. °8. ACTIVITIES:  You may resume regular (light) daily activities beginning the next day--such as daily self-care, walking, climbing stairs--gradually increasing activities as tolerated.  You may have sexual intercourse when it is comfortable.  Refrain from any heavy lifting or straining until approved by your doctor. °a. You may drive when you are no longer taking prescription pain medication, you can comfortably wear a seatbelt, and you can safely maneuver your car and apply brakes. °b. RETURN TO WORK:   2-3 weeks °9. You should see your doctor in the office for a follow-up appointment approximately 2-3 weeks after your surgery.  Make sure that you call for this appointment within a day or two after you arrive home to insure a convenient appointment time. °10. OTHER INSTRUCTIONS: ________________________________________________________________________ °WHEN TO CALL YOUR DOCTOR: °1. Fever over 101.0 °2. Inability to urinate °3. Continued bleeding from incision. °4. Increased pain, redness, or drainage from the incision. °5. Increasing abdominal pain ° °The clinic staff is available to answer your questions during regular business hours.  Please don’t hesitate to call and ask to speak to one of the nurses for clinical concerns.  If you have a medical emergency, go to the nearest emergency room or call 911.  A surgeon from [facility] is always on call at the hospital. °[address], [address], [place], [place]  [address] ? [address] [address], [place], [place]   [address] °[phone] ? [phone] ? FAX [phone] °Web site:   [email or web address] ° °

## 2015-08-10 NOTE — Interval H&P Note (Signed)
History and Physical Interval Note:  08/10/2015 7:38 AM  Julia Schultz  has presented today for surgery, with the diagnosis of Chronic calculus cholecystitis   The various methods of treatment have been discussed with the patient and family. After consideration of risks, benefits and other options for treatment, the patient has consented to  Procedure(s): LAPAROSCOPIC CHOLECYSTECTOMY WITH INTRAOPERATIVE CHOLANGIOGRAM (N/A) as a surgical intervention .  The patient's history has been reviewed, patient examined, no change in status, stable for surgery.  I have reviewed the patient's chart and labs.  Questions were answered to the patient's satisfaction.     Terik Haughey K.

## 2015-08-10 NOTE — Op Note (Signed)
Laparoscopic Cholecystectomy with IOC Procedure Note  Indications: This patient presents with symptomatic gallbladder disease and will undergo laparoscopic cholecystectomy.  Pre-operative Diagnosis: Calculus of gallbladder with other cholecystitis, without mention of obstruction  Post-operative Diagnosis: Same  Surgeon: Roena Sassaman K.   Assistants: none  Anesthesia: General endotracheal anesthesia  ASA Class: 1  Procedure Details  The patient was seen again in the Holding Room. The risks, benefits, complications, treatment options, and expected outcomes were discussed with the patient. The possibilities of reaction to medication, pulmonary aspiration, perforation of viscus, bleeding, recurrent infection, finding a normal gallbladder, the need for additional procedures, failure to diagnose a condition, the possible need to convert to an open procedure, and creating a complication requiring transfusion or operation were discussed with the patient. The likelihood of improving the patient's symptoms with return to their baseline status is good.  The patient and/or family concurred with the proposed plan, giving informed consent. The site of surgery properly noted. The patient was taken to Operating Room, identified as Julia Schultz and the procedure verified as Laparoscopic Cholecystectomy with Intraoperative Cholangiogram. A Time Out was held and the above information confirmed.  Prior to the induction of general anesthesia, antibiotic prophylaxis was administered. General endotracheal anesthesia was then administered and tolerated well. After the induction, the abdomen was prepped with Chloraprep and draped in the sterile fashion. The patient was positioned in the supine position.  Local anesthetic agent was injected into the skin near the umbilicus and an incision made. We dissected down to the abdominal fascia with blunt dissection.  The fascia was incised vertically and we entered the  peritoneal cavity bluntly.  A pursestring suture of 0-Vicryl was placed around the fascial opening.  The Hasson cannula was inserted and secured with the stay suture.  Pneumoperitoneum was then created with CO2 and tolerated well without any adverse changes in the patient's vital signs. An 11-mm port was placed in the subxiphoid position.  Two 5-mm ports were placed in the right upper quadrant. All skin incisions were infiltrated with a local anesthetic agent before making the incision and placing the trocars.   We positioned the patient in reverse Trendelenburg, tilted slightly to the patient's left.  The gallbladder was identified, the fundus grasped and retracted cephalad. There were significant adhesions to the fundus of the gallbladder.  Adhesions were lysed bluntly and with the electrocautery where indicated, taking care not to injure any adjacent organs or viscus. The infundibulum was grasped and retracted laterally, exposing the peritoneum overlying the triangle of Calot. This was then divided and exposed in a blunt fashion. A critical view of the cystic duct and cystic artery was obtained.  The cystic duct was clearly identified and bluntly dissected circumferentially. The cystic duct was ligated with a clip distally.   An incision was made in the cystic duct and the [facility] cholangiogram catheter introduced. The catheter was secured using a clip. A cholangiogram was then obtained which showed good visualization of the distal and proximal biliary tree with no sign of filling defects or obstruction.  Contrast flowed easily into the duodenum. The catheter was then removed.   The cystic duct was then ligated with clips and divided. The cystic artery was identified, dissected free, ligated with clips and divided as well.   The gallbladder was dissected from the liver bed in retrograde fashion with the electrocautery. The gallbladder was removed and placed in an Endocatch sac. The liver bed was irrigated and  inspected. Hemostasis was achieved with the electrocautery.  Copious irrigation was utilized and was repeatedly aspirated until clear.  The gallbladder and Endocatch sac were then removed through the umbilical port site.  The pursestring suture was used to close the umbilical fascia.  There were a few omental adhesions around the umbilicus that were taken down with cautery scissors.  We again inspected the right upper quadrant for hemostasis.  Pneumoperitoneum was released as we removed the trocars.  4-0 Monocryl was used to close the skin.   Benzoin, steri-strips, and clean dressings were applied. The patient was then extubated and brought to the recovery room in stable condition. Instrument, sponge, and needle counts were correct at closure and at the conclusion of the case.   Findings: Cholecystitis with Cholelithiasis  Estimated Blood Loss: Minimal         Drains: none         Specimens: Gallbladder           Complications: None; patient tolerated the procedure well.         Disposition: PACU - hemodynamically stable.         Condition: stable  [name]. [name], MD, [facility] Surgery  General/ Trauma Surgery  08/10/2015 9:34 AM

## 2015-08-10 NOTE — Transfer of Care (Signed)
Immediate Anesthesia Transfer of Care Note  Patient: Julia Schultz  Procedure(s) Performed: Procedure(s): LAPAROSCOPIC CHOLECYSTECTOMY WITH INTRAOPERATIVE CHOLANGIOGRAM (N/A)  Patient Location: PACU  Anesthesia Type:General  Level of Consciousness: awake, alert , oriented and patient cooperative  Airway & Oxygen Therapy: Patient Spontanous Breathing and Patient connected to nasal cannula oxygen  Post-op Assessment: Report given to RN and Post -op Vital signs reviewed and stable  Post vital signs: Reviewed and stable  Last Vitals:  Filed Vitals:   08/10/15 0658 08/10/15 0950  BP:    Pulse:    Temp: 36.8 C 36.5 C  Resp:      Complications: No apparent anesthesia complications

## 2015-08-10 NOTE — H&P (View-Only) (Signed)
History of Present Illness (Shi Blankenship K. Ruwayda Curet MD; 07/25/2015 12:39 PM) Patient words: Julia Schultz.  The patient is a 35 year old female who presents for evaluation of gall stones. Referred by Dr. Jyothi Mann for gallbladder disease  This is a 35-year-old female who presents with several years of intermittent digestive symptoms. She reports frequent constipation. She also reports postprandial bloating, cramping, as well as nausea. She developed abdominal pain in her epigastrium and right upper quadrant. When the symptoms get worse the pain spreads across to her left. Her family has noticed that this happens every time she tries to eat. She recently saw Dr. Mann for constipation. Ultrasound and labs were obtained. Liver function tests were normal. Ultrasound showed multiple gallstones  CLINICAL DATA: Nausea, epigastric abdominal pain for 5 years  EXAM: ABDOMEN ULTRASOUND COMPLETE  COMPARISON: None  FINDINGS: Gallbladder: Multiple mobile shadowing calculi within gallbladder up to 13 mm diameter. Normal gallbladder wall thickness. No pericholecystic fluid or sonographic Murphy sign.  Common bile duct: Diameter: Normal caliber 3 mm diameter.  Liver: Normal appearance  IVC: Normal appearance  Pancreas: Normal appearance  Spleen: Normal appearance, 6.4 cm length  Right Kidney: Length: 10.3 cm. Normal morphology without mass or hydronephrosis.  Left Kidney: Length: 11.9 cm. Normal morphology without mass or hydronephrosis.  Abdominal aorta: Normal caliber  Other findings: No free fluid  IMPRESSION: Cholelithiasis without evidence acute cholecystitis or biliary obstruction.   Electronically Signed By: Mark Boles M.D. On: 07/04/2015 09:18     Other Problems ([name], LPN; 07/25/2015 10:04 AM) Cholelithiasis Hemorrhoids  Past Surgical History ([name], LPN; 07/25/2015 10:04 AM) Cesarean Section - Multiple Foot Surgery Left. Mammoplasty; Reduction  Bilateral.  Diagnostic Studies History ([name], LPN; 07/25/2015 10:04 AM) Colonoscopy never Mammogram never Pap Smear 1-5 years ago  Allergies ([name], LPN; 07/25/2015 10:05 AM) No Known Drug Allergies 07/25/2015  Medication History ([name], LPN; 07/25/2015 10:05 AM) Multiple Vitamin (Oral) Active. Medications Reconciled  Social History ([name], LPN; 07/25/2015 10:04 AM) Alcohol use Occasional alcohol use. Caffeine use Coffee, Tea. No drug use Tobacco use Never smoker.  Family History ([name], LPN; 07/25/2015 10:04 AM) Bleeding disorder Mother. Diabetes Mellitus Mother. Hypertension Father, Mother. Ischemic Bowel Disease Mother. Malignant Neoplasm Of Pancreas Father.  Pregnancy / Birth History ([name], LPN; 07/25/2015 10:04 AM) Gravida 4 Maternal age 15-20 Para 4 Regular periods     Review of Systems ([name] LPN; 07/25/2015 10:04 AM) General Present- Fatigue. Not Present- Appetite Loss, Chills, Fever, Night Sweats, Weight Gain and Weight Loss. Skin Not Present- Change in Wart/Mole, Dryness, Hives, Jaundice, New Lesions, Non-Healing Wounds, Rash and Ulcer. HEENT Present- Seasonal Allergies and Wears glasses/contact lenses. Not Present- Earache, Hearing Loss, Hoarseness, Nose Bleed, Oral Ulcers, Ringing in the Ears, Sinus Pain, Sore Throat, Visual Disturbances and Yellow Eyes. Respiratory Present- Snoring. Not Present- Bloody sputum, Chronic Cough, Difficulty Breathing and Wheezing. Breast Not Present- Breast Mass, Breast Pain, Nipple Discharge and Skin Changes. Cardiovascular Present- Leg Cramps. Not Present- Chest Pain, Difficulty Breathing Lying Down, Palpitations, Rapid Heart Rate, Shortness of Breath and Swelling of Extremities. Gastrointestinal Present- Abdominal Pain, Bloating, Constipation, Gets full quickly at meals, Hemorrhoids, Indigestion and Nausea. Not Present- Bloody Stool, Change in Bowel  Habits, Chronic diarrhea, Difficulty Swallowing, Excessive gas, Rectal Pain and Vomiting. Female Genitourinary Not Present- Frequency, Nocturia, Painful Urination, Pelvic Pain and Urgency. Musculoskeletal Not Present- Back Pain, Joint Pain, Joint Stiffness, Muscle Pain, Muscle Weakness and Swelling of Extremities. Neurological Not Present- Decreased Memory, Fainting, Headaches,   Numbness, Seizures, Tingling, Tremor, Trouble walking and Weakness. Psychiatric Not Present- Anxiety, Bipolar, Change in Sleep Pattern, Depression, Fearful and Frequent crying. Endocrine Not Present- Cold Intolerance, Excessive Hunger, Hair Changes, Heat Intolerance, Hot flashes and New Diabetes. Hematology Present- Easy Bruising and Excessive bleeding. Not Present- Gland problems, HIV and Persistent Infections.  Vitals ([name] LPN; 1/91/4782 95:62 AM) 07/25/2015 10:04 AM Weight: 215.2 lb Height: 63in Body Surface Area: 1.99 m Body Mass Index: 38.12 kg/m  Temp.: 98.52F(Oral)  Pulse: 70 (Regular)  BP: 116/70 (Sitting, Left Arm, Standard)      Physical Exam Molli Hazard K. Lorinda Copland MD; 07/25/2015 12:40 PM)  The physical exam findings are as follows: Note:WDWN in NAD HEENT: EOMI, sclera anicteric Neck: No masses, no thyromegaly Lungs: CTA bilaterally; normal respiratory effort CV: Regular rate and rhythm; no murmurs Abd: +bowel sounds, soft, mildly tender in RUQ and epigastrium; no palpable masses Ext: Well-perfused; no edema Skin: Warm, dry; no sign of jaundice    Assessment & Plan Molli Hazard K. Jayven Naill MD; 07/25/2015 10:40 AM)  CHRONIC CHOLECYSTITIS WITH CALCULUS (K80.10)  Current Plans Schedule for Surgery - Laparoscopic cholecystectomy with intraoperative cholangiogram. The surgical procedure has been discussed with the patient. Potential risks, benefits, alternative treatments, and expected outcomes have been explained. All of the patient's questions at this time have been answered. The  likelihood of reaching the patient's treatment goal is good. The patient understand the proposed surgical procedure and wishes to proceed. Pt Education - Pamphlet Given - Laparoscopic Gallbladder Surgery: discussed with patient and provided information.  [name]. [name], MD, [facility] Surgery  General/ Trauma Surgery  07/25/2015 12:41 PM

## 2015-08-10 NOTE — Anesthesia Postprocedure Evaluation (Signed)
Anesthesia Post Note  Patient: Julia Schultz  Procedure(s) Performed: Procedure(s) (LRB): LAPAROSCOPIC CHOLECYSTECTOMY WITH INTRAOPERATIVE CHOLANGIOGRAM (N/A)  Patient location during evaluation: PACU Anesthesia Type: General Level of consciousness: awake Pain management: pain level controlled Vital Signs Assessment: post-procedure vital signs reviewed and stable Respiratory status: spontaneous breathing Cardiovascular status: stable Anesthetic complications: no    Last Vitals:  Filed Vitals:   08/10/15 1050 08/10/15 1105  BP: 132/91 119/80  Pulse: 62 68  Temp:    Resp: 6 10    Last Pain:  Filed Vitals:   08/10/15 1118  PainSc: Asleep                 EDWARDS,Lahna Nath

## 2015-08-10 NOTE — OR Nursing (Signed)
Therapeutic communication and distraction unsuccessful in patient finding resolution to anxiety. Mother brought to area to provide further emotional support in hopes to promoting pt's improved well being. Pt's anxiety excalated and MD informed.

## 2015-08-10 NOTE — Anesthesia Preprocedure Evaluation (Addendum)
Anesthesia Evaluation  Patient identified by MRN, date of birth, ID band Patient awake    Reviewed: Allergy & Precautions, NPO status , Patient's Chart, lab work & pertinent test results  Airway Mallampati: II  TM Distance: >3 FB Neck ROM: Full    Dental   Pulmonary neg pulmonary ROS,  breath sounds clear to auscultation        Cardiovascular negative cardio ROS  Rhythm:Regular Rate:Normal     Neuro/Psych    GI/Hepatic Neg liver ROS, GERD-  ,  Endo/Other  negative endocrine ROS  Renal/GU negative Renal ROS     Musculoskeletal   Abdominal   Peds  Hematology   Anesthesia Other Findings   Reproductive/Obstetrics                            Anesthesia Physical Anesthesia Plan  ASA: II  Anesthesia Plan: General   Post-op Pain Management:    Induction: Intravenous  Airway Management Planned: Oral ETT  Additional Equipment:   Intra-op Plan:   Post-operative Plan: Extubation in OR  Informed Consent: I have reviewed the patients History and Physical, chart, labs and discussed the procedure including the risks, benefits and alternatives for the proposed anesthesia with the patient or authorized representative who has indicated his/her understanding and acceptance.   Dental advisory given  Plan Discussed with: CRNA and Anesthesiologist  Anesthesia Plan Comments:         Anesthesia Quick Evaluation  

## 2015-08-10 NOTE — OR Nursing (Signed)
Pt w/ vss in displaying unrelenting distress, anxiety intermittently hyperventilating, holding breath, crying. Denies pain. O2 sats remain WNL , breathsounds clear. MD notified.

## 2015-08-11 ENCOUNTER — Encounter (HOSPITAL_COMMUNITY): Payer: Self-pay | Admitting: Surgery

## 2016-03-07 ENCOUNTER — Telehealth: Payer: Self-pay | Admitting: Nurse Practitioner

## 2016-03-07 NOTE — Telephone Encounter (Signed)
agree

## 2016-03-07 NOTE — Telephone Encounter (Signed)
Patient is calling to speak with the nurse. No information given.

## 2016-03-07 NOTE — Telephone Encounter (Signed)
Spoke with patient. Patient states she has not had a period since July 23rd. Patient reports her cycles have been regular for the last 1.5 years, prior to July. Patient reports BTL in 2010. Patient states she is sexually active. Patient reports questionable hot flashes in August. Home UPT negative the first week of September. Patient reports breast tenderness, feeling  "bloated and gassy". Advised patient she would need to come into office for further evaluation. Patient states only available on Wednesdays and reports she can't come in today. Patient scheduled for 03/14/16 at 10:15 am with Ria CommentPatricia Grubb, NP. Advised patient should symptoms change or worsen, call to be seen sooner or visit urgent care/ER. Patient is agreeable to date and time.   Ria CommentPatricia Grubb, do you agree with recommendations?

## 2016-03-13 ENCOUNTER — Telehealth: Payer: Self-pay | Admitting: Nurse Practitioner

## 2016-03-13 NOTE — Telephone Encounter (Signed)
Returned call, unable to leave message. VM not set up.

## 2016-03-13 NOTE — Telephone Encounter (Signed)
Spoke with patient. Patient states that she started her menses yesterday and her bleeding is heavy. She is scheduled to be seen tomorrow for evaluation of late menses. Patient has had a BTL. LMP prior to this cycle was on 12/02/2015. Reports her menses is usually very regular. Patient is changing her tampon every 1 and 1/2 hours due to tampon filling and occasionally bleeding through. Advised she will need to keep her appointment as scheduled for 03/14/2016 at 10:15 am with Julia CommentPatricia Grubb, FNP for further evaluation. Patient is agreeable and verbalizes understanding.  Routing to provider for final review. Patient agreeable to disposition. Will close encounter.

## 2016-03-13 NOTE — Telephone Encounter (Signed)
Patient is scheduled for an appointment for tomorrow for missed cycle but she has started her cycle and it is heavy. She wants to know if she should still come in for this appointment or not.

## 2016-03-14 ENCOUNTER — Ambulatory Visit (INDEPENDENT_AMBULATORY_CARE_PROVIDER_SITE_OTHER): Payer: BLUE CROSS/BLUE SHIELD | Admitting: Nurse Practitioner

## 2016-03-14 ENCOUNTER — Encounter: Payer: Self-pay | Admitting: Nurse Practitioner

## 2016-03-14 VITALS — BP 110/70 | HR 64 | Resp 16 | Ht 64.0 in | Wt 233.0 lb

## 2016-03-14 DIAGNOSIS — N926 Irregular menstruation, unspecified: Secondary | ICD-10-CM | POA: Diagnosis not present

## 2016-03-14 DIAGNOSIS — D508 Other iron deficiency anemias: Secondary | ICD-10-CM

## 2016-03-14 DIAGNOSIS — E559 Vitamin D deficiency, unspecified: Secondary | ICD-10-CM

## 2016-03-14 LAB — CBC WITH DIFFERENTIAL/PLATELET
Basophils Absolute: 0 cells/uL (ref 0–200)
Basophils Relative: 0 %
EOS ABS: 48 {cells}/uL (ref 15–500)
Eosinophils Relative: 1 %
HEMATOCRIT: 36.7 % (ref 35.0–45.0)
Hemoglobin: 12.2 g/dL (ref 11.7–15.5)
Lymphocytes Relative: 29 %
Lymphs Abs: 1392 cells/uL (ref 850–3900)
MCH: 29.8 pg (ref 27.0–33.0)
MCHC: 33.2 g/dL (ref 32.0–36.0)
MCV: 89.5 fL (ref 80.0–100.0)
MONO ABS: 336 {cells}/uL (ref 200–950)
MONOS PCT: 7 %
MPV: 11.2 fL (ref 7.5–12.5)
NEUTROS PCT: 63 %
Neutro Abs: 3024 cells/uL (ref 1500–7800)
Platelets: 160 10*3/uL (ref 140–400)
RBC: 4.1 MIL/uL (ref 3.80–5.10)
RDW: 13.6 % (ref 11.0–15.0)
WBC: 4.8 10*3/uL (ref 3.8–10.8)

## 2016-03-14 LAB — IBC PANEL
%SAT: 31 % (ref 11–50)
TIBC: 334 ug/dL (ref 250–450)
UIBC: 229 ug/dL (ref 125–400)

## 2016-03-14 LAB — IRON: Iron: 105 ug/dL (ref 40–190)

## 2016-03-14 LAB — POCT URINE PREGNANCY: Preg Test, Ur: NEGATIVE

## 2016-03-14 NOTE — Progress Notes (Signed)
35 y.o. Married [place] female G4P4 here for evaluation of AUB.  S/P BTL.  LMP  12/29/15 that lasted 4-5 days.  This cycle was lighter than usual which was good as she was going through training with her new job..  No menses in   August and September, even though she did get some usual PMS symptoms.  UPT was negative about 10 th of September.    Then on 03/12/16 menses started and flow has been heavy on Monday and Tuesday.  She is a little lighter today.  Feels tired and maybe a little light headed today but has not eaten.   O: Healthy WD,WN female Affect: normal, she was given nabs and a drink while here Skin: warm and dry Abdomen:soft, non tender, normal bowel sounds Pelvic exam:EXTERNAL GENITALIA: normal appearing vulva with no masses, tenderness or lesions VAGINA: no abnormal discharge or lesions and blood moderate amount CERVIX: no lesions or cervical motion tenderness. Os is closed  UPT:  Negative HGB: 9.8 fingerstick   A: S/P BTL 2010  Amenorrhea X 2 months and now heavy bleeding  Anemia history with pregnancy - off any current iron supplements  History of Vit d deficiency    P:  Discussed findings of anemia and she will restart iron OTC.  She will also restart OTC Vit D.  Continue to monitor bleeding - if heavier or prolonged to call us back  Follow with labs   Labs:  TIBC, iron, ferritin, CBC, Vit D   Instructions given regarding:  RV

## 2016-03-14 NOTE — Patient Instructions (Addendum)
Call if bleeding is longer than 7 days Get Slow FE OTC and take daily    Iron Deficiency Anemia, Adult Anemia is a condition in which there are less red blood cells or hemoglobin in the blood than normal. Hemoglobin is the part of red blood cells that carries oxygen. Iron deficiency anemia is anemia caused by too little iron. It is the most common type of anemia. It may leave you tired and short of breath. CAUSES   Lack of iron in the diet.  Poor absorption of iron, as seen with intestinal disorders.  Intestinal bleeding.  Heavy periods. SIGNS AND SYMPTOMS  Mild anemia may not be noticeable. Symptoms may include:  Fatigue.  Headache.  Pale skin.  Weakness.  Tiredness.  Shortness of breath.  Dizziness.  Cold hands and feet.  Fast or irregular heartbeat. DIAGNOSIS  Diagnosis requires a thorough evaluation and physical exam by your health care provider. Blood tests are generally used to confirm iron deficiency anemia. Additional tests may be done to find the underlying cause of your anemia. These may include:  Testing for blood in the stool (fecal occult blood test).  A procedure to see inside the colon and rectum (colonoscopy).  A procedure to see inside the esophagus and stomach (endoscopy). TREATMENT  Iron deficiency anemia is treated by correcting the cause of the deficiency. Treatment may involve:  Adding iron-rich foods to your diet.  Taking iron supplements. Pregnant or breastfeeding women need to take extra iron because their normal diet usually does not provide the required amount.  Taking vitamins. Vitamin C improves the absorption of iron. Your health care provider may recommend that you take your iron tablets with a glass of orange juice or vitamin C supplement.  Medicines to make heavy menstrual flow lighter.  Surgery. HOME CARE INSTRUCTIONS   Take iron as directed by your health care provider.  If you cannot tolerate taking iron supplements by  mouth, talk to your health care provider about taking them through a vein (intravenously) or an injection into a muscle.  For the best iron absorption, iron supplements should be taken on an empty stomach. If you cannot tolerate them on an empty stomach, you may need to take them with food.  Do not drink milk or take antacids at the same time as your iron supplements. Milk and antacids may interfere with the absorption of iron.  Iron supplements can cause constipation. Make sure to include fiber in your diet to prevent constipation. A stool softener may also be recommended.  Take vitamins as directed by your health care provider.  Eat a diet rich in iron. Foods high in iron include liver, lean beef, whole-grain bread, eggs, dried fruit, and dark green leafy vegetables. SEEK IMMEDIATE MEDICAL CARE IF:   You faint. If this happens, do not drive. Call your local emergency services (911 in [place] if no other help is available.  You have chest pain.  You feel nauseous or vomit.  You have severe or increased shortness of breath with activity.  You feel weak.  You have a rapid heartbeat.  You have unexplained sweating.  You become light-headed when getting up from a chair or bed. MAKE SURE YOU:   Understand these instructions.  Will watch your condition.  Will get help right away if you are not doing well or get worse.   This information is not intended to replace advice given to you by your health care provider. Make sure you discuss any questions you  have with your health care provider.   Document Released: 05/25/2000 Document Revised: 06/18/2014 Document Reviewed: 02/02/2013 Elsevier Interactive Patient Education [employer].

## 2016-03-15 LAB — VITAMIN D 25 HYDROXY (VIT D DEFICIENCY, FRACTURES): VIT D 25 HYDROXY: 22 ng/mL — AB (ref 30–100)

## 2016-03-15 LAB — FERRITIN: FERRITIN: 13 ng/mL (ref 10–154)

## 2016-03-16 LAB — HEMOGLOBIN, FINGERSTICK: Hemoglobin, fingerstick: 12.2 g/dL (ref 12.0–16.0)

## 2016-03-16 NOTE — Progress Notes (Signed)
Encounter reviewed by Dr. Brook Amundson C. Silva.  

## 2016-03-19 ENCOUNTER — Telehealth: Payer: Self-pay | Admitting: *Deleted

## 2016-03-19 NOTE — Telephone Encounter (Signed)
-----   Message from Ria Comment, FNP sent at 03/16/2016  9:43 AM EDT ----- Please let pt know that her HGB was better than the fingerstick and was 12.2.  The other iron studies were normal but the Vit D was low again at 22.  She really needs to get back on her Vit D OTC at 2000 IU daily.

## 2016-03-19 NOTE — Telephone Encounter (Signed)
I have attempted to contact this patient by phone with the following results: voicemail has not been set up.  [phone] (Home)

## 2016-03-21 NOTE — Addendum Note (Signed)
Addended by: Francee PiccoloPHILLIPS, STEPHANIE C on: 03/21/2016 01:45 PM   Modules accepted: Orders

## 2016-03-21 NOTE — Telephone Encounter (Signed)
Pt notified in result note.  Closing encounter. 

## 2016-06-11 HISTORY — PX: COLPOSCOPY: SHX161

## 2016-06-12 ENCOUNTER — Telehealth: Payer: Self-pay | Admitting: *Deleted

## 2016-06-12 NOTE — Telephone Encounter (Signed)
Patient in 08 recall for 05/2016. Please contact patient to schedule AEX /PAP  

## 2016-06-21 NOTE — Telephone Encounter (Signed)
Phone call to patient to schedule.  Voicemail has not been set up on only number provided.  [phone] (Home)  I will try again later.

## 2016-06-22 NOTE — Telephone Encounter (Signed)
Spoke with patient and scheduled her AEX on 06/27/16 @ 3pm.

## 2016-06-27 ENCOUNTER — Ambulatory Visit: Payer: Self-pay | Admitting: Nurse Practitioner

## 2016-07-11 NOTE — Telephone Encounter (Signed)
Patients 06/27/16 appointment cancelled due to weather. Called and rescheduled for 07-25-16 with PG -eh

## 2016-07-25 ENCOUNTER — Encounter: Payer: Self-pay | Admitting: Certified Nurse Midwife

## 2016-07-25 ENCOUNTER — Encounter: Payer: Self-pay | Admitting: Nurse Practitioner

## 2016-07-25 ENCOUNTER — Ambulatory Visit: Payer: Self-pay | Admitting: Nurse Practitioner

## 2016-07-25 ENCOUNTER — Ambulatory Visit (INDEPENDENT_AMBULATORY_CARE_PROVIDER_SITE_OTHER): Payer: BLUE CROSS/BLUE SHIELD | Admitting: Certified Nurse Midwife

## 2016-07-25 VITALS — BP 110/70 | HR 74 | Resp 16 | Ht 63.75 in | Wt 234.0 lb

## 2016-07-25 DIAGNOSIS — Z01419 Encounter for gynecological examination (general) (routine) without abnormal findings: Secondary | ICD-10-CM | POA: Diagnosis not present

## 2016-07-25 DIAGNOSIS — Z0000 Encounter for general adult medical examination without abnormal findings: Secondary | ICD-10-CM | POA: Diagnosis not present

## 2016-07-25 DIAGNOSIS — Z124 Encounter for screening for malignant neoplasm of cervix: Secondary | ICD-10-CM

## 2016-07-25 LAB — POCT URINALYSIS DIPSTICK
BILIRUBIN UA: NEGATIVE
Blood, UA: NEGATIVE
Glucose, UA: NEGATIVE
KETONES UA: NEGATIVE
LEUKOCYTES UA: NEGATIVE
Nitrite, UA: NEGATIVE
PROTEIN UA: NEGATIVE
Urobilinogen, UA: NEGATIVE
pH, UA: 5

## 2016-07-25 NOTE — Progress Notes (Deleted)
Patient ID: Julia Schultz, female   DOB: 1980-09-24, 36 y.o.   MRN: [id number]  36 y.o. [id number] Married  African American Fe here for annual exam.    No LMP recorded.          Sexually active: {yes no:314532}  The current method of family planning is {contraception:315051}.    Exercising: {yes no:314532}  {types:19826} Smoker:  {YES [id number]:22349}  Health Maintenance: Pap: 05/27/15, Negative with pos HR HPV, Negative 16/18/45  10/21/12, Negative with neg HR HPV TDaP: 05/27/15 HIV: 05/27/15 Labs: ***   reports that she has never smoked. She has never used smokeless tobacco. She reports that she drinks alcohol. She reports that she does not use drugs.  Past Medical History:  Diagnosis Date  . Anemia   . Chronic cholecystitis with calculus   . Complication of anesthesia    woke as a child during procedure  . GERD (gastroesophageal reflux disease)   . Headache    migraines  . Menorrhagia   . Thrombocytopenia (HCC) 2005    Past Surgical History:  Procedure Laterality Date  . BREAST SURGERY  2003   breast reduction   . CESAREAN SECTION     x 3  . CHOLECYSTECTOMY N/A 08/10/2015   Procedure: LAPAROSCOPIC CHOLECYSTECTOMY WITH INTRAOPERATIVE CHOLANGIOGRAM;  Surgeon: Manus RuddMatthew Tsuei, MD;  Location: [facility] OR;  Service: General;  Laterality: N/A;  . FOOT SURGERY Left child 4th grade   correct arch  . TUBAL LIGATION Bilateral 2010    Current Outpatient Prescriptions  Medication Sig Dispense Refill  . cholecalciferol (VITAMIN D) 1000 units tablet Take 1,000 Units by mouth daily.    . Multiple Vitamins-Minerals (MULTIVITAMIN PO) Take 1 tablet by mouth daily.      No current facility-administered medications for this visit.     Family History  Problem Relation Age of Onset  . Hyperlipidemia Mother   . Hypertension Mother   . Diabetes Mother   . Diverticulitis Mother   . Pancreatic cancer Father     ROS:  Pertinent items are noted in HPI.  Otherwise, a comprehensive ROS was  negative.  Exam:   There were no vitals taken for this visit.   Ht Readings from Last 3 Encounters:  03/14/16 5\' 4"  (1.626 m)  08/10/15 5\' 3"  (1.6 m)  08/08/15 5' 3.5" (1.613 m)    General appearance: alert, cooperative and appears stated age Head: Normocephalic, without obvious abnormality, atraumatic Neck: no adenopathy, supple, symmetrical, trachea midline and thyroid {EXAM; THYROID:18604} Lungs: clear to auscultation bilaterally Breasts: {Exam; breast:13139::"normal appearance, no masses or tenderness"} Heart: regular rate and rhythm Abdomen: soft, non-tender; no masses,  no organomegaly Extremities: extremities normal, atraumatic, no cyanosis or edema Skin: Skin color, texture, turgor normal. No rashes or lesions Lymph nodes: Cervical, supraclavicular, and axillary nodes normal. No abnormal inguinal nodes palpated Neurologic: Grossly normal   Pelvic: External genitalia:  no lesions              Urethra:  normal appearing urethra with no masses, tenderness or lesions              Bartholin's and Skene's: normal                 Vagina: normal appearing vagina with normal color and discharge, no lesions              Cervix: {exam; cervix:14595}              Pap taken: {yes no:314532} Bimanual Exam:  Uterus:  {  exam; uterus:12215}              Adnexa: {exam; adnexa:12223}               Rectovaginal: Confirms               Anus:  normal sphincter tone, no lesions  Chaperone present: ***  A:  Well Woman with normal exam  P:   Reviewed health and wellness pertinent to exam  Pap smear as above  {plan; gyn:5269::"mammogram","pap smear","return annually or prn"}  An After Visit Summary was printed and given to the patient.

## 2016-07-25 NOTE — Patient Instructions (Signed)

## 2016-07-25 NOTE — Progress Notes (Signed)
36 y.o. [id number] Married(separated)  African American Fe here for annual exam. Periods had been irregular. Regular period in 10/17,12/17, 1/18, Just finished her period which is normal.  Has been working on good diet and exercise with weight loss 15 pounds. Trying to stay healthy. Son sick with fever and stomach virus. Now call from school regarding daughter who now is sick at school. Social stress with trying to care for children. Desires STD screening and screening labs if needed. Sees Urgent care if needed.  Patient's last menstrual period was 07/14/2016 (exact date).          Sexually active: Yes.    The current method of family planning is tubal ligation.    Exercising: Yes.    squats, jogging, treadmill Smoker:  no  Health Maintenance: Pap:  10-21-12 neg HPV HR neg, 05-27-15 neg HPV HR+ 16/18 neg MMG:  none Colonoscopy:  none BMD:   none TDaP:  2016 Shingles: no Pneumonia: no Hep C and HIV: HIV neg 2016 Labs: poct urine-neg Self breast exam: done occ   reports that she has never smoked. She has never used smokeless tobacco. She reports that she does not drink alcohol or use drugs.  Past Medical History:  Diagnosis Date  . Anemia   . Chronic cholecystitis with calculus   . Complication of anesthesia    woke as a child during procedure  . GERD (gastroesophageal reflux disease)   . Headache    migraines  . Menorrhagia   . Thrombocytopenia (HCC) 2005    Past Surgical History:  Procedure Laterality Date  . BREAST SURGERY  2003   breast reduction   . CESAREAN SECTION     x 3  . CHOLECYSTECTOMY N/A 08/10/2015   Procedure: LAPAROSCOPIC CHOLECYSTECTOMY WITH INTRAOPERATIVE CHOLANGIOGRAM;  Surgeon: Manus RuddMatthew Tsuei, MD;  Location: [facility] OR;  Service: General;  Laterality: N/A;  . FOOT SURGERY Left child 4th grade   correct arch  . TUBAL LIGATION Bilateral 2010    Current Outpatient Prescriptions  Medication Sig Dispense Refill  . cholecalciferol (VITAMIN D) 1000 units tablet Take  1,000 Units by mouth daily.    . Maca Root (MACA PO) Take by mouth.    . Multiple Vitamins-Minerals (MULTIVITAMIN PO) Take 1 tablet by mouth daily.      No current facility-administered medications for this visit.     Family History  Problem Relation Age of Onset  . Hyperlipidemia Mother   . Hypertension Mother   . Diabetes Mother   . Diverticulitis Mother   . Pancreatic cancer Father     ROS:  Pertinent items are noted in HPI.  Otherwise, a comprehensive ROS was negative.  Exam:   BP 110/70   Pulse 74   Resp 16   Ht 5' 3.75" (1.619 m)   Wt 234 lb (106.1 kg)   LMP 07/14/2016 (Exact Date)   BMI 40.48 kg/m  Height: 5' 3.75" (161.9 cm) Ht Readings from Last 3 Encounters:  07/25/16 5' 3.75" (1.619 m)  03/14/16 5\' 4"  (1.626 m)  08/10/15 5\' 3"  (1.6 m)    General appearance: alert, cooperative and appears stated age Head: Normocephalic, without obvious abnormality, atraumatic Neck: no adenopathy, supple, symmetrical, trachea midline and thyroid normal to inspection and palpation Lungs: clear to auscultation bilaterally Breasts: normal appearance, no masses or tenderness, No nipple retraction or dimpling, No nipple discharge or bleeding, No axillary or supraclavicular adenopathy Heart: regular rate and rhythm Abdomen: soft, non-tender; no masses,  no organomegaly Extremities: extremities normal,  atraumatic, no cyanosis or edema Skin: Skin color, texture, turgor normal. No rashes or lesions Lymph nodes: Cervical, supraclavicular, and axillary nodes normal. No abnormal inguinal nodes palpated Neurologic: Grossly normal   Pelvic: External genitalia:  no lesions              Urethra:  normal appearing urethra with no masses, tenderness or lesions              Bartholin's and Skene's: normal                 Vagina: normal appearing vagina with normal color and discharge, no lesions              Cervix: multiparous appearance, no cervical motion tenderness and no lesions               Pap taken: Yes.   Bimanual Exam:  Uterus:  normal size, contour, position, consistency, mobility, non-tender              Adnexa: normal adnexa and no mass, fullness, tenderness               Rectovaginal: Confirms               Anus:  normal sphincter tone, no lesions  Chaperone present: yes  A:  Well Woman with normal exam  Contraception tubal  Weight loss program in process  Follow up pap smear from last pap with HPVHR + negative 16,18  Screening labs   P:   Reviewed health and wellness pertinent to exam  Continue program with weight loss to help with prevention of other health issues.  Lab: HIV,RPR, GC,Chlamydia, Affirm  Pap smear as above with HPVHR   counseled on breast self exam, STD prevention, HIV risk factors and prevention, adequate intake of calcium and vitamin D, diet and exercise  return annually or prn  An After Visit Summary was printed and given to the patient.

## 2016-07-26 LAB — VITAMIN D 25 HYDROXY (VIT D DEFICIENCY, FRACTURES): Vit D, 25-Hydroxy: 24 ng/mL — ABNORMAL LOW (ref 30–100)

## 2016-07-26 LAB — HIV ANTIBODY (ROUTINE TESTING W REFLEX): HIV: NONREACTIVE

## 2016-07-26 LAB — WET PREP BY MOLECULAR PROBE
Candida species: NEGATIVE
GARDNERELLA VAGINALIS: POSITIVE — AB
Trichomonas vaginosis: NEGATIVE

## 2016-07-26 LAB — RPR

## 2016-07-26 LAB — IPS N GONORRHOEA AND CHLAMYDIA BY PCR

## 2016-07-26 NOTE — Progress Notes (Signed)
Encounter reviewed Julia Hossain, MD   

## 2016-07-27 ENCOUNTER — Other Ambulatory Visit: Payer: Self-pay | Admitting: Certified Nurse Midwife

## 2016-07-27 ENCOUNTER — Other Ambulatory Visit: Payer: Self-pay

## 2016-07-27 DIAGNOSIS — R87612 Low grade squamous intraepithelial lesion on cytologic smear of cervix (LGSIL): Secondary | ICD-10-CM

## 2016-07-27 LAB — IPS PAP TEST WITH HPV

## 2016-07-27 MED ORDER — METRONIDAZOLE 500 MG PO TABS: 500.0000 mg | ORAL_TABLET | Freq: Two times a day (BID) | ORAL | 0 refills | Status: DC

## 2016-08-03 ENCOUNTER — Telehealth: Payer: Self-pay | Admitting: Certified Nurse Midwife

## 2016-08-03 NOTE — Telephone Encounter (Signed)
Called patient to review benefits for procedure. Voice mail not set up. Unable to leave voicemail.

## 2016-08-08 ENCOUNTER — Encounter: Payer: Self-pay | Admitting: Certified Nurse Midwife

## 2016-08-08 ENCOUNTER — Ambulatory Visit (INDEPENDENT_AMBULATORY_CARE_PROVIDER_SITE_OTHER): Payer: BLUE CROSS/BLUE SHIELD | Admitting: Certified Nurse Midwife

## 2016-08-08 VITALS — BP 110/78 | HR 70 | Resp 16 | Ht 63.75 in | Wt 230.0 lb

## 2016-08-08 DIAGNOSIS — R87612 Low grade squamous intraepithelial lesion on cytologic smear of cervix (LGSIL): Secondary | ICD-10-CM | POA: Diagnosis not present

## 2016-08-08 NOTE — Patient Instructions (Signed)

## 2016-08-08 NOTE — Progress Notes (Signed)
Encounter reviewed Nigel Ericsson, MD   

## 2016-08-08 NOTE — Progress Notes (Addendum)
Patient ID: Julia Schultz, female   DOB: January 10, 1981, 36 y.o.   MRN: [id number]  Chief Complaint  Patient presents with  . Colposcopy    //jj    HPI Julia Schultz is a 36 y.o. african american separated [id number] female.  Here for colposcopy. Denies pelvic pain and vaginal bleeding. 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 Previous colposcopy: none. Prior cervical treatment: none  Previous pap smear in 2016 showed + HPVHR with negative 16,18 genotype  Past Medical History:  Diagnosis Date  . Anemia   . Chronic cholecystitis with calculus   . Complication of anesthesia    woke as a child during procedure  . GERD (gastroesophageal reflux disease)   . Headache    migraines  . Menorrhagia   . Thrombocytopenia (HCC) 2005    Past Surgical History:  Procedure Laterality Date  . BREAST SURGERY  2003   breast reduction   . CESAREAN SECTION     x 3  . CHOLECYSTECTOMY N/A 08/10/2015   Procedure: LAPAROSCOPIC CHOLECYSTECTOMY WITH INTRAOPERATIVE CHOLANGIOGRAM;  Surgeon: Manus Rudd, MD;  Location: [facility] OR;  Service: General;  Laterality: N/A;  . FOOT SURGERY Left child 4th grade   correct arch  . TUBAL LIGATION Bilateral 2010    Family History  Problem Relation Age of Onset  . Hyperlipidemia Mother   . Hypertension Mother   . Diabetes Mother   . Diverticulitis Mother   . Pancreatic cancer Father     Social History Social History  Substance Use Topics  . Smoking status: Never Smoker  . Smokeless tobacco: Never Used  . Alcohol use No    Allergies  Allergen Reactions  . Banana Anaphylaxis    "lump in throat"  . Latex Itching  . Other     Walnuts cause itchy throat  . Tomato Hives and Swelling    Current Outpatient Prescriptions  Medication Sig Dispense Refill  . cholecalciferol (VITAMIN D) 1000 units tablet Take 1,000 Units by mouth daily.    . Maca Root (MACA PO) Take by mouth.    . Multiple Vitamins-Minerals (MULTIVITAMIN PO) Take 1 tablet by mouth  daily.      No current facility-administered medications for this visit.     Review of Systems Review of Systems  Constitutional: Negative.   Gastrointestinal: Negative.   Genitourinary: Negative for vaginal bleeding, vaginal discharge and vaginal pain.    Blood pressure 110/78, pulse 70, resp. rate 16, height 5' 3.75" (1.619 m), weight 230 lb (104.3 kg), last menstrual period 07/14/2016.  Physical Exam Physical Exam  Constitutional: She is oriented to person, place, and time. She appears well-developed and well-nourished.  Genitourinary: Vagina normal. There is no rash, tenderness or lesion on the right labia. There is no rash, tenderness or lesion on the left labia. No tenderness in the vagina. No vaginal discharge found.    Neurological: She is alert and oriented to person, place, and time.  Skin: Skin is warm and dry.  Psychiatric: She has a normal mood and affect. Her behavior is normal. Judgment and thought content normal.    Data Reviewed Reviewed pap smear results. Questions addressed.  Assessment    Procedure Details  The risks and benefits of the procedure and Written informed consent obtained.  Speculum placed in vagina and excellent visualization of cervix achieved, cervix swabbed x 3 with saline and  acetic acid solution. Cervix viewed with 3.75,7.5,15 # and green filter with acetowhite area noted at 2 o'clock Lugol's  applied with non staining noted. Biopsy at 2 o'clock. ECC taken. Monsel's applied with no bleeding noted on removal of speculum. Patient tolerated procedure well. Instructions given  Specimens: 2  Complications: none.     Plan    Specimens labelled and sent to Pathology. Patient will be called with results when in and reviewed. Pathology reviewed: Biopsy showed squamous mucosa with no dysplasia or HPV effect, ECC showed LSIL with HPV effect, benign endocervical glandular tissue. Patient to be called with result and need for follow up pap in one  year. Pap recall 08      [facility] CenterEONARD,Amandine Covino 08/08/2016, 2:32 PM

## 2016-08-08 NOTE — Progress Notes (Signed)
07-25-16 LGSIL on pap Pt was given 800mg  ibuprofen at 2:30pm Lot [id number] exp 12/18

## 2016-08-10 ENCOUNTER — Telehealth: Payer: Self-pay

## 2016-08-10 LAB — IPS OTHER TISSUE BIOPSY

## 2016-08-10 NOTE — Telephone Encounter (Signed)
Spoke with patient. Advised of message and results as seen below from Leota Sauerseborah Leonard CNM. Patient verbalizes understanding. Patient declines to schedule aex at this time. Will return call to schedule. 08 recall placed.  Routing to provider for final review. Patient agreeable to disposition. Will close encounter.

## 2016-08-10 NOTE — Telephone Encounter (Signed)
-----   Message from Julia Schultz, CNM sent at 08/10/2016 12:07 PM EST ----- Notify patient that Biopsy showed no dysplasia of HPV effect ECC showed squamous mucosa with LSIL and benign glandular tissue. No treatment at this time Very important to have pap again in one year as follow up 08 recall

## 2017-08-12 ENCOUNTER — Telehealth: Payer: Self-pay | Admitting: *Deleted

## 2017-08-12 NOTE — Telephone Encounter (Signed)
Patient is in 08 recall for 07/2017. Please contact patient regarding scheduling AEX /PAP  Thanks   

## 2017-08-15 NOTE — Telephone Encounter (Signed)
aex is scheduled for 08/27/17

## 2017-08-15 NOTE — Telephone Encounter (Signed)
Left message for patient to call & schedule aex due to previous pap history. 

## 2017-08-16 ENCOUNTER — Other Ambulatory Visit: Payer: Self-pay

## 2017-08-16 ENCOUNTER — Other Ambulatory Visit (HOSPITAL_COMMUNITY)
Admission: RE | Admit: 2017-08-16 | Discharge: 2017-08-16 | Disposition: A | Discharge status: Home / Self Care | Payer: BLUE CROSS/BLUE SHIELD | Source: Ambulatory Visit | Attending: Obstetrics & Gynecology | Admitting: Obstetrics & Gynecology

## 2017-08-16 ENCOUNTER — Encounter: Payer: Self-pay | Admitting: Certified Nurse Midwife

## 2017-08-16 ENCOUNTER — Ambulatory Visit: Payer: BLUE CROSS/BLUE SHIELD | Admitting: Certified Nurse Midwife

## 2017-08-16 VITALS — BP 122/84 | HR 70 | Resp 16 | Ht 63.5 in | Wt 252.0 lb

## 2017-08-16 DIAGNOSIS — Z01419 Encounter for gynecological examination (general) (routine) without abnormal findings: Secondary | ICD-10-CM

## 2017-08-16 DIAGNOSIS — Z124 Encounter for screening for malignant neoplasm of cervix: Secondary | ICD-10-CM | POA: Insufficient documentation

## 2017-08-16 DIAGNOSIS — Z87898 Personal history of other specified conditions: Secondary | ICD-10-CM | POA: Diagnosis not present

## 2017-08-16 DIAGNOSIS — Z8742 Personal history of other diseases of the female genital tract: Secondary | ICD-10-CM

## 2017-08-16 DIAGNOSIS — E559 Vitamin D deficiency, unspecified: Secondary | ICD-10-CM | POA: Diagnosis not present

## 2017-08-16 DIAGNOSIS — Z0000 Encounter for general adult medical examination without abnormal findings: Secondary | ICD-10-CM | POA: Diagnosis not present

## 2017-08-16 DIAGNOSIS — D696 Thrombocytopenia, unspecified: Secondary | ICD-10-CM | POA: Insufficient documentation

## 2017-08-16 NOTE — Progress Notes (Signed)
37 y.o. [id number] Married  African American Fe here for annual exam.. Periods have been changing. Had period 11/18, had period in February also, have been every 2 months for the past year. Periods moderate none really heavy or long. Sees Urgent care if needed and was seen for Sinus infection this year. 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 Family fine, no other health issues today. Working on [employer] again!!  Patient's last menstrual period was 07/19/2017 (exact date).          Sexually active: No.  The current method of family planning is tubal ligation.    Exercising: No.  exercise Smoker:  no  Health Maintenance: Pap:  05-27-15 neg HPV HR+, 16,18/45 neg, 2-14 18 LGSIL, HPV HR neg History of Abnormal Pap: yes MMG:  none Self Breast exams: occ Colonoscopy:  none BMD:   none TDaP:  2016 Shingles: no Pneumonia: no Hep C and HIV: HIV neg 2018 Labs: if needed.   reports that  has never smoked. she has never used smokeless tobacco. She reports that she drinks alcohol. She reports that she does not use drugs.  Past Medical History:  Diagnosis Date  . Anemia   . Chronic cholecystitis with calculus   . Complication of anesthesia    woke as a child during procedure  . GERD (gastroesophageal reflux disease)   . Headache    migraines  . Menorrhagia   . Thrombocytopenia (HCC) 2005    Past Surgical History:  Procedure Laterality Date  . BREAST SURGERY  2003   breast reduction   . CESAREAN SECTION     x 3  . CHOLECYSTECTOMY N/A 08/10/2015   Procedure: LAPAROSCOPIC CHOLECYSTECTOMY WITH INTRAOPERATIVE CHOLANGIOGRAM;  Surgeon: Manus RuddMatthew Tsuei, MD;  Location: [facility] OR;  Service: General;  Laterality: N/A;  . FOOT SURGERY Left child 4th grade   correct arch  . TUBAL LIGATION Bilateral 2010    Current Outpatient Medications  Medication Sig Dispense Refill  . cholecalciferol (VITAMIN D) 1000 units tablet Take 1,000 Units by mouth daily.    .  Multiple Vitamins-Minerals (MULTIVITAMIN PO) Take 1 tablet by mouth daily.      No current facility-administered medications for this visit.     Family History  Problem Relation Age of Onset  . Hyperlipidemia Mother   . Hypertension Mother   . Diabetes Mother   . Diverticulitis Mother   . Pancreatic cancer Father     ROS:  Pertinent items are noted in HPI.  Otherwise, a comprehensive ROS was negative.  Exam:   BP 122/84   Pulse 70   Resp 16   Ht 5' 3.5" (1.613 m)   Wt 252 lb (114.3 kg)   LMP 07/19/2017 (Exact Date)   BMI 43.94 kg/m  Height: 5' 3.5" (161.3 cm) Ht Readings from Last 3 Encounters:  08/16/17 5' 3.5" (1.613 m)  08/08/16 5' 3.75" (1.619 m)  07/25/16 5' 3.75" (1.619 m)    General appearance: alert, cooperative and appears stated age Head: Normocephalic, without obvious abnormality, atraumatic Neck: no adenopathy, supple, symmetrical, trachea midline and thyroid normal to inspection and palpation Lungs: clear to auscultation bilaterally Breasts: normal appearance, no masses or tenderness, No nipple retraction or dimpling, No nipple discharge or bleeding, No axillary or supraclavicular adenopathy Heart: regular rate and rhythm Abdomen: soft, non-tender; no masses,  no organomegaly Extremities: extremities normal, atraumatic, no cyanosis or edema Skin: Skin color, texture, turgor normal. No rashes or lesions Lymph  nodes: Cervical, supraclavicular, and axillary nodes normal. No abnormal inguinal nodes palpated Neurologic: Grossly normal   Pelvic: External genitalia:  no lesions              Urethra:  normal appearing urethra with no masses, tenderness or lesions              Bartholin's and Skene's: normal                 Vagina: normal appearing vagina with normal color and discharge, no lesions              Cervix: multiparous appearance, no cervical motion tenderness and no lesions              Pap taken: Yes.   Bimanual Exam:  Uterus:  normal size,  contour, position, consistency, mobility, non-tender and anteverted              Adnexa: normal adnexa and no mass, fullness, tenderness               Rectovaginal: Confirms               Anus:  normal sphincter Chaperone present: yes  A:  Well Woman with normal exam  Contraception BTL  History of LSIL pap with Colpo follow up pap today.  Obesity on weight loss again  Screening labs  P:   Reviewed health and wellness pertinent to exam  Encouraged to work on weight and exercise to prevent other health issues  Labs: CMP,Lipid panel, Vitamin D, TSH, CBC  Pap smear: yes   counseled on breast self exam, adequate intake of calcium and vitamin D, diet and exercise  return annually or prn  An After Visit Summary was printed and given to the patient.

## 2017-08-16 NOTE — Patient Instructions (Signed)

## 2017-08-17 LAB — COMPREHENSIVE METABOLIC PANEL
ALT: 14 IU/L (ref 0–32)
AST: 17 IU/L (ref 0–40)
Albumin/Globulin Ratio: 1.6 (ref 1.2–2.2)
Albumin: 4.1 g/dL (ref 3.5–5.5)
Alkaline Phosphatase: 52 IU/L (ref 39–117)
BILIRUBIN TOTAL: 0.2 mg/dL (ref 0.0–1.2)
BUN/Creatinine Ratio: 15 (ref 9–23)
BUN: 13 mg/dL (ref 6–20)
CHLORIDE: 102 mmol/L (ref 96–106)
CO2: 26 mmol/L (ref 20–29)
Calcium: 9.1 mg/dL (ref 8.7–10.2)
Creatinine, Ser: 0.89 mg/dL (ref 0.57–1.00)
GFR calc Af Amer: 96 mL/min/{1.73_m2} (ref >59)
GFR calc non Af Amer: 84 mL/min/{1.73_m2} (ref >59)
GLUCOSE: 87 mg/dL (ref 65–99)
Globulin, Total: 2.5 g/dL (ref 1.5–4.5)
Potassium: 3.8 mmol/L (ref 3.5–5.2)
Sodium: 141 mmol/L (ref 134–144)
Total Protein: 6.6 g/dL (ref 6.0–8.5)

## 2017-08-17 LAB — CBC
HEMATOCRIT: 35.7 % (ref 34.0–46.6)
HEMOGLOBIN: 11.9 g/dL (ref 11.1–15.9)
MCH: 29.9 pg (ref 26.6–33.0)
MCHC: 33.3 g/dL (ref 31.5–35.7)
MCV: 90 fL (ref 79–97)
Platelets: 143 10*3/uL — ABNORMAL LOW (ref 150–379)
RBC: 3.98 x10E6/uL (ref 3.77–5.28)
RDW: 13.7 % (ref 12.3–15.4)
WBC: 5 10*3/uL (ref 3.4–10.8)

## 2017-08-17 LAB — LIPID PANEL
Chol/HDL Ratio: 3.5 ratio (ref 0.0–4.4)
Cholesterol, Total: 188 mg/dL (ref 100–199)
HDL: 54 mg/dL (ref >39)
LDL Calculated: 123 mg/dL — ABNORMAL HIGH (ref 0–99)
Triglycerides: 54 mg/dL (ref 0–149)
VLDL CHOLESTEROL CAL: 11 mg/dL (ref 5–40)

## 2017-08-17 LAB — VITAMIN D 25 HYDROXY (VIT D DEFICIENCY, FRACTURES): Vit D, 25-Hydroxy: 25.5 ng/mL — ABNORMAL LOW (ref 30.0–100.0)

## 2017-08-17 LAB — TSH: TSH: 2.14 u[IU]/mL (ref 0.450–4.500)

## 2017-08-20 ENCOUNTER — Other Ambulatory Visit: Payer: Self-pay | Admitting: Certified Nurse Midwife

## 2017-08-20 DIAGNOSIS — E559 Vitamin D deficiency, unspecified: Secondary | ICD-10-CM

## 2017-08-20 DIAGNOSIS — Z862 Personal history of diseases of the blood and blood-forming organs and certain disorders involving the immune mechanism: Secondary | ICD-10-CM

## 2017-08-20 LAB — CYTOLOGY - PAP
Diagnosis: NEGATIVE
HPV: NOT DETECTED

## 2017-08-21 ENCOUNTER — Telehealth: Payer: Self-pay | Admitting: *Deleted

## 2017-08-21 NOTE — Telephone Encounter (Signed)
Notes recorded by Leda MinHamm, Brystol Wasilewski N, RN on 08/21/2017 at 12:13 PM EDT Left message to call Noreene LarssonJill at [phone].  ------  Notes recorded by Verner CholLeonard, Deborah S, CNM on 08/21/2017 at 7:38 AM EDT Pap smear negative HPVHR not detected 02 previous ------  Notes recorded by Verner CholLeonard, Deborah S, CNM on 08/20/2017 at 12:38 PM EDT Notify patient that TSH is normal Lipid panel shows normal cholesterol of 188, Triglycerides Normal at 54, HDL protective cholesterol at 54, LDL cholesterol harmful cholesterol at 123 Elevated, normal <99 Work on regular exercise and limited fats in diet, recheck with next aex Liver, kidney and glucose normal CBC is normal Except for slightly low platelets, she has had this before in 2017 Vitamin D is low at 25.5 needs to start on 2000 IUD Vitamin d 3 daily OTC and recheck in 3 months, she can also recheck CBC at that time orders placed

## 2017-08-21 NOTE — Telephone Encounter (Signed)
Spoke with patient, advised as seen below per Leota Sauerseborah Leonard, CNM. Patient declined to schedule lab recheck at this time, will return call to schedule. Patient verbalizes understanding and is agreeable.   Routing to provider for final review. Patient is agreeable to disposition. Will close encounter.

## 2018-01-02 ENCOUNTER — Other Ambulatory Visit (INDEPENDENT_AMBULATORY_CARE_PROVIDER_SITE_OTHER): Payer: BLUE CROSS/BLUE SHIELD

## 2018-01-02 DIAGNOSIS — E559 Vitamin D deficiency, unspecified: Secondary | ICD-10-CM

## 2018-01-02 DIAGNOSIS — Z862 Personal history of diseases of the blood and blood-forming organs and certain disorders involving the immune mechanism: Secondary | ICD-10-CM

## 2018-01-03 ENCOUNTER — Other Ambulatory Visit: Payer: Self-pay

## 2018-01-03 DIAGNOSIS — E559 Vitamin D deficiency, unspecified: Secondary | ICD-10-CM

## 2018-01-03 LAB — CBC
HEMATOCRIT: 38.5 % (ref 34.0–46.6)
Hemoglobin: 12.6 g/dL (ref 11.1–15.9)
MCH: 30 pg (ref 26.6–33.0)
MCHC: 32.7 g/dL (ref 31.5–35.7)
MCV: 92 fL (ref 79–97)
Platelets: 159 10*3/uL (ref 150–450)
RBC: 4.2 x10E6/uL (ref 3.77–5.28)
RDW: 14.5 % (ref 12.3–15.4)
WBC: 5.2 10*3/uL (ref 3.4–10.8)

## 2018-01-03 LAB — VITAMIN D 25 HYDROXY (VIT D DEFICIENCY, FRACTURES): Vit D, 25-Hydroxy: 20 ng/mL — ABNORMAL LOW (ref 30.0–100.0)

## 2018-04-08 ENCOUNTER — Other Ambulatory Visit: Payer: BLUE CROSS/BLUE SHIELD

## 2018-04-08 DIAGNOSIS — E559 Vitamin D deficiency, unspecified: Secondary | ICD-10-CM

## 2018-04-09 ENCOUNTER — Other Ambulatory Visit: Payer: Self-pay

## 2018-04-09 DIAGNOSIS — E559 Vitamin D deficiency, unspecified: Secondary | ICD-10-CM

## 2018-04-09 LAB — VITAMIN D 25 HYDROXY (VIT D DEFICIENCY, FRACTURES): VIT D 25 HYDROXY: 20.6 ng/mL — AB (ref 30.0–100.0)

## 2018-04-09 MED ORDER — VITAMIN D (ERGOCALCIFEROL) 1.25 MG (50000 UNIT) PO CAPS: 50000.0000 [IU] | ORAL_CAPSULE | ORAL | 0 refills | Status: DC

## 2018-07-09 ENCOUNTER — Other Ambulatory Visit: Payer: Self-pay | Admitting: Certified Nurse Midwife

## 2018-07-09 ENCOUNTER — Other Ambulatory Visit (INDEPENDENT_AMBULATORY_CARE_PROVIDER_SITE_OTHER): Payer: 59

## 2018-07-09 DIAGNOSIS — Z113 Encounter for screening for infections with a predominantly sexual mode of transmission: Secondary | ICD-10-CM

## 2018-07-09 DIAGNOSIS — E559 Vitamin D deficiency, unspecified: Secondary | ICD-10-CM

## 2018-07-09 NOTE — Progress Notes (Signed)
Patient requested STD serology screening with Vitamin D recheck, due to health concerns. Agreeable to drawing today.

## 2018-07-10 ENCOUNTER — Other Ambulatory Visit: Payer: Self-pay | Admitting: Certified Nurse Midwife

## 2018-07-10 ENCOUNTER — Other Ambulatory Visit: Payer: Self-pay

## 2018-07-10 DIAGNOSIS — E559 Vitamin D deficiency, unspecified: Secondary | ICD-10-CM

## 2018-07-10 LAB — VITAMIN D 25 HYDROXY (VIT D DEFICIENCY, FRACTURES): Vit D, 25-Hydroxy: 31.1 ng/mL (ref 30.0–100.0)

## 2018-07-10 LAB — HEP, RPR, HIV PANEL
HIV Screen 4th Generation wRfx: NONREACTIVE
Hepatitis B Surface Ag: NEGATIVE
RPR Ser Ql: NONREACTIVE

## 2018-07-10 LAB — HEPATITIS C ANTIBODY: Hep C Virus Ab: <0.1 s/co ratio (ref 0.0–0.9)

## 2018-07-11 MED ORDER — VITAMIN D (ERGOCALCIFEROL) 1.25 MG (50000 UNIT) PO CAPS: 50000.0000 [IU] | ORAL_CAPSULE | ORAL | 0 refills | Status: DC

## 2018-08-29 ENCOUNTER — Encounter: Payer: Self-pay | Admitting: Certified Nurse Midwife

## 2018-08-29 ENCOUNTER — Ambulatory Visit: Payer: 59 | Admitting: Certified Nurse Midwife

## 2018-08-29 ENCOUNTER — Other Ambulatory Visit (HOSPITAL_COMMUNITY)
Admission: RE | Admit: 2018-08-29 | Discharge: 2018-08-29 | Disposition: A | Discharge status: Home / Self Care | Payer: 59 | Source: Ambulatory Visit | Attending: Obstetrics & Gynecology | Admitting: Obstetrics & Gynecology

## 2018-08-29 ENCOUNTER — Other Ambulatory Visit: Payer: Self-pay

## 2018-08-29 VITALS — BP 120/80 | HR 70 | Temp 98.2°F | Resp 16 | Ht 63.75 in | Wt 249.0 lb

## 2018-08-29 DIAGNOSIS — Z124 Encounter for screening for malignant neoplasm of cervix: Secondary | ICD-10-CM | POA: Insufficient documentation

## 2018-08-29 DIAGNOSIS — Z01419 Encounter for gynecological examination (general) (routine) without abnormal findings: Secondary | ICD-10-CM | POA: Diagnosis not present

## 2018-08-29 DIAGNOSIS — E663 Overweight: Secondary | ICD-10-CM | POA: Diagnosis not present

## 2018-08-29 DIAGNOSIS — N912 Amenorrhea, unspecified: Secondary | ICD-10-CM

## 2018-08-29 DIAGNOSIS — E559 Vitamin D deficiency, unspecified: Secondary | ICD-10-CM

## 2018-08-29 NOTE — Patient Instructions (Signed)

## 2018-08-29 NOTE — Progress Notes (Signed)
38 y.o. [id number] Married  African American Fe here for annual exam. Periods have been changing to every other month and now has been 3 months. Has noted symptoms of period onset, breast tenderness, increase discharge, but bleeding. Has been working on weight loss and continues to try to eat healthy. Sees Urgent care only if needed. Requests screening labs if needed. No other health issues today.  No LMP recorded.          Sexually active: Yes.    The current method of family planning is tubal ligation.    Exercising: No.  exercise Smoker:  no  Review of Systems  Constitutional: Negative.   HENT: Negative.   Eyes: Negative.   Respiratory: Negative.   Cardiovascular: Negative.   Gastrointestinal: Negative.   Genitourinary: Negative.   Musculoskeletal: Negative.   Skin: Negative.   Neurological: Negative.   Endo/Heme/Allergies: Negative.   Psychiatric/Behavioral: Negative.     Health Maintenance: Pap:  07-25-16 LGSIL HPV HR neg, 08-16-17 neg HPV HR neg History of Abnormal Pap: yes MMG:  none Self Breast exams: no Colonoscopy:  none BMD:   none TDaP:  2016 Shingles: no Pneumonia: no Hep C and HIV: both neg 2020 Labs: yes   reports that she has never smoked. She has never used smokeless tobacco. She reports current alcohol use of about 1.0 standard drinks of alcohol per week. She reports that she does not use drugs.  Past Medical History:  Diagnosis Date  . 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 Anemia   . Chronic cholecystitis with calculus   . Complication of anesthesia    woke as a child during procedure  . GERD (gastroesophageal reflux disease)   . Headache    migraines  . Menorrhagia   . Thrombocytopenia (HCC) 2005    Past Surgical History:  Procedure Laterality Date  . BREAST SURGERY  2003   breast reduction   . CESAREAN SECTION     x 3  . CHOLECYSTECTOMY N/A 08/10/2015   Procedure: LAPAROSCOPIC CHOLECYSTECTOMY WITH INTRAOPERATIVE  CHOLANGIOGRAM;  Surgeon: Manus Rudd, MD;  Location: [facility] OR;  Service: General;  Laterality: N/A;  . COLPOSCOPY  2018  . FOOT SURGERY Left child 4th grade   correct arch  . TUBAL LIGATION Bilateral 2010    Current Outpatient Medications  Medication Sig Dispense Refill  . cholecalciferol (VITAMIN D) 1000 units tablet Take 1,000 Units by mouth daily.    . Multiple Vitamins-Minerals (MULTIVITAMIN PO) Take 1 tablet by mouth daily.     . Vitamin D, Ergocalciferol, (DRISDOL) 1.25 MG (50000 UT) CAPS capsule Take by mouth.     No current facility-administered medications for this visit.     Family History  Problem Relation Age of Onset  . Hyperlipidemia Mother   . Hypertension Mother   . Diabetes Mother   . Diverticulitis Mother   . Pancreatic cancer Father     ROS:  Pertinent items are noted in HPI.  Otherwise, a comprehensive ROS was negative.  Exam:   BP 120/80   Pulse 70   Temp 98.2 F (36.8 C) (Oral)   Resp 16   Ht 5' 3.75" (1.619 m)   Wt 249 lb (112.9 kg)   BMI 43.08 kg/m  Height: 5' 3.75" (161.9 cm) Ht Readings from Last 3 Encounters:  08/29/18 5' 3.75" (1.619 m)  08/16/17 5' 3.5" (1.613 m)  08/08/16 5' 3.75" (1.619 m)    General appearance: alert, cooperative  and appears stated age Head: Normocephalic, without obvious abnormality, atraumatic Neck: no adenopathy, supple, symmetrical, trachea midline and thyroid normal to inspection and palpation Lungs: clear to auscultation bilaterally Breasts: normal appearance, no masses or tenderness, No nipple retraction or dimpling, No nipple discharge or bleeding, No axillary or supraclavicular adenopathy, pendulous bilateral Heart: regular rate and rhythm Abdomen: soft, non-tender; no masses,  no organomegaly Extremities: extremities normal, atraumatic, no cyanosis or edema Skin: Skin color, texture, turgor normal. No rashes or lesions Lymph nodes: Cervical, supraclavicular, and axillary nodes normal. No abnormal inguinal  nodes palpated Neurologic: Grossly normal   Pelvic: External genitalia:  no lesions              Urethra:  normal appearing urethra with no masses, tenderness or lesions              Bartholin's and Skene's: normal                 Vagina: normal appearing vagina with normal color and discharge, no lesions              Cervix: no cervical motion tenderness, no lesions and normal appearance              Pap taken: Yes.   Bimanual Exam:  Uterus:  normal size, contour, position, consistency, mobility, non-tender and retroverted              Adnexa: normal adnexa and no mass, fullness, tenderness               Rectovaginal: Confirms               Anus:  normal sphincter tone, no lesions  Chaperone present: yes  A:  Well Woman with normal exam  Contraception BTL  Amenorrhea x 3 months   Overweight working on weight loss  Screening labs  P:   Reviewed health and wellness pertinent to exam  Discussed concern with amenorrhea for 3 months and need to evaluate with labs. Increases risks of heavy menses, no period and need to occur. Discussed can be related to weight change, thyroid or Pituitary or hormonal change. If normal will need Provera challenge if period had not started. Questions addresssed.  Labs: TSH,FSH, Prolactin  Discussed weight loss will help with avoiding her health issues such as diabetes, hypertension. Encouraged to continue to work on and start regular exercise program.  Labs:Lipid panel, CMP, Vitamin D  Pap smear: yes   counseled on breast self exam, feminine hygiene, adequate intake of calcium and vitamin D, diet and exercise  return annually or prn  An After Visit Summary was printed and given to the patient.

## 2018-08-30 LAB — COMPREHENSIVE METABOLIC PANEL
ALT: 102 IU/L — AB (ref 0–32)
AST: 62 IU/L — AB (ref 0–40)
Albumin/Globulin Ratio: 2.1 (ref 1.2–2.2)
Albumin: 4.4 g/dL (ref 3.8–4.8)
Alkaline Phosphatase: 67 IU/L (ref 39–117)
BUN/Creatinine Ratio: 10 (ref 9–23)
BUN: 10 mg/dL (ref 6–20)
Bilirubin Total: 0.3 mg/dL (ref 0.0–1.2)
CO2: 27 mmol/L (ref 20–29)
Calcium: 9.7 mg/dL (ref 8.7–10.2)
Chloride: 97 mmol/L (ref 96–106)
Creatinine, Ser: 0.96 mg/dL (ref 0.57–1.00)
GFR calc Af Amer: 87 mL/min/{1.73_m2} (ref >59)
GFR calc non Af Amer: 76 mL/min/{1.73_m2} (ref >59)
Globulin, Total: 2.1 g/dL (ref 1.5–4.5)
Glucose: 86 mg/dL (ref 65–99)
Potassium: 3.8 mmol/L (ref 3.5–5.2)
Sodium: 137 mmol/L (ref 134–144)
Total Protein: 6.5 g/dL (ref 6.0–8.5)

## 2018-08-30 LAB — FOLLICLE STIMULATING HORMONE: FSH: 26.3 m[IU]/mL

## 2018-08-30 LAB — LIPID PANEL
Chol/HDL Ratio: 3.7 ratio (ref 0.0–4.4)
Cholesterol, Total: 217 mg/dL — ABNORMAL HIGH (ref 100–199)
HDL: 58 mg/dL (ref >39)
LDL Calculated: 141 mg/dL — ABNORMAL HIGH (ref 0–99)
TRIGLYCERIDES: 91 mg/dL (ref 0–149)
VLDL CHOLESTEROL CAL: 18 mg/dL (ref 5–40)

## 2018-08-30 LAB — TSH: TSH: 2.29 u[IU]/mL (ref 0.450–4.500)

## 2018-08-30 LAB — VITAMIN D 25 HYDROXY (VIT D DEFICIENCY, FRACTURES): Vit D, 25-Hydroxy: 33.3 ng/mL (ref 30.0–100.0)

## 2018-08-30 LAB — PROLACTIN: Prolactin: 7.8 ng/mL (ref 4.8–23.3)

## 2018-09-01 ENCOUNTER — Other Ambulatory Visit: Payer: Self-pay

## 2018-09-01 DIAGNOSIS — R748 Abnormal levels of other serum enzymes: Secondary | ICD-10-CM

## 2018-09-01 MED ORDER — VITAMIN D (ERGOCALCIFEROL) 1.25 MG (50000 UNIT) PO CAPS: 50000.0000 [IU] | ORAL_CAPSULE | ORAL | 0 refills | Status: DC

## 2018-09-01 MED ORDER — MEDROXYPROGESTERONE ACETATE 5 MG PO TABS: ORAL_TABLET | ORAL | 0 refills | Status: DC

## 2018-09-02 LAB — CYTOLOGY - PAP: Diagnosis: NEGATIVE

## 2018-09-03 LAB — BETA HCG QUANT (REF LAB): hCG Quant: <1 m[IU]/mL

## 2018-09-03 LAB — SPECIMEN STATUS REPORT

## 2018-09-18 ENCOUNTER — Telehealth: Payer: Self-pay | Admitting: General Practice

## 2018-09-18 NOTE — Telephone Encounter (Signed)
Called pt and left vm to schedule NP appt with Earlene Plater

## 2018-10-08 ENCOUNTER — Other Ambulatory Visit: Payer: 59

## 2018-10-31 ENCOUNTER — Encounter (HOSPITAL_BASED_OUTPATIENT_CLINIC_OR_DEPARTMENT_OTHER): Payer: Self-pay

## 2018-10-31 ENCOUNTER — Emergency Department (HOSPITAL_BASED_OUTPATIENT_CLINIC_OR_DEPARTMENT_OTHER): Payer: 59

## 2018-10-31 ENCOUNTER — Other Ambulatory Visit: Payer: Self-pay

## 2018-10-31 ENCOUNTER — Emergency Department (HOSPITAL_BASED_OUTPATIENT_CLINIC_OR_DEPARTMENT_OTHER)
Admission: EM | Admit: 2018-10-31 | Discharge: 2018-10-31 | Disposition: A | Discharge status: Home / Self Care | Payer: 59 | Attending: Emergency Medicine | Admitting: Emergency Medicine

## 2018-10-31 DIAGNOSIS — M25562 Pain in left knee: Secondary | ICD-10-CM | POA: Insufficient documentation

## 2018-10-31 DIAGNOSIS — Y939 Activity, unspecified: Secondary | ICD-10-CM | POA: Diagnosis not present

## 2018-10-31 DIAGNOSIS — M25561 Pain in right knee: Secondary | ICD-10-CM | POA: Insufficient documentation

## 2018-10-31 DIAGNOSIS — Z23 Encounter for immunization: Secondary | ICD-10-CM | POA: Diagnosis not present

## 2018-10-31 DIAGNOSIS — Z91040 Latex allergy status: Secondary | ICD-10-CM | POA: Diagnosis not present

## 2018-10-31 DIAGNOSIS — R51 Headache: Secondary | ICD-10-CM | POA: Diagnosis not present

## 2018-10-31 DIAGNOSIS — S301XXA Contusion of abdominal wall, initial encounter: Secondary | ICD-10-CM | POA: Diagnosis not present

## 2018-10-31 DIAGNOSIS — M79642 Pain in left hand: Secondary | ICD-10-CM | POA: Insufficient documentation

## 2018-10-31 DIAGNOSIS — Z79899 Other long term (current) drug therapy: Secondary | ICD-10-CM | POA: Insufficient documentation

## 2018-10-31 DIAGNOSIS — Y92410 Unspecified street and highway as the place of occurrence of the external cause: Secondary | ICD-10-CM | POA: Diagnosis not present

## 2018-10-31 DIAGNOSIS — S20211A Contusion of right front wall of thorax, initial encounter: Secondary | ICD-10-CM | POA: Diagnosis not present

## 2018-10-31 DIAGNOSIS — Y999 Unspecified external cause status: Secondary | ICD-10-CM | POA: Insufficient documentation

## 2018-10-31 DIAGNOSIS — S29001A Unspecified injury of muscle and tendon of front wall of thorax, initial encounter: Secondary | ICD-10-CM | POA: Diagnosis present

## 2018-10-31 LAB — COMPREHENSIVE METABOLIC PANEL
ALT: 20 U/L (ref 0–44)
AST: 23 U/L (ref 15–41)
Albumin: 3.9 g/dL (ref 3.5–5.0)
Alkaline Phosphatase: 53 U/L (ref 38–126)
Anion gap: 7 (ref 5–15)
BUN: 10 mg/dL (ref 6–20)
CO2: 24 mmol/L (ref 22–32)
Calcium: 8.5 mg/dL — ABNORMAL LOW (ref 8.9–10.3)
Chloride: 108 mmol/L (ref 98–111)
Creatinine, Ser: 0.92 mg/dL (ref 0.44–1.00)
GFR calc Af Amer: >60 mL/min (ref >60)
GFR calc non Af Amer: >60 mL/min (ref >60)
Glucose, Bld: 126 mg/dL — ABNORMAL HIGH (ref 70–99)
Potassium: 3.9 mmol/L (ref 3.5–5.1)
Sodium: 139 mmol/L (ref 135–145)
Total Bilirubin: 0.5 mg/dL (ref 0.3–1.2)
Total Protein: 6.6 g/dL (ref 6.5–8.1)

## 2018-10-31 LAB — CBC
HCT: 39.2 % (ref 36.0–46.0)
Hemoglobin: 12.7 g/dL (ref 12.0–15.0)
MCH: 30 pg (ref 26.0–34.0)
MCHC: 32.4 g/dL (ref 30.0–36.0)
MCV: 92.7 fL (ref 80.0–100.0)
Platelets: 128 10*3/uL — ABNORMAL LOW (ref 150–400)
RBC: 4.23 MIL/uL (ref 3.87–5.11)
RDW: 13.7 % (ref 11.5–15.5)
WBC: 5 10*3/uL (ref 4.0–10.5)
nRBC: 0 % (ref 0.0–0.2)

## 2018-10-31 LAB — PREGNANCY, URINE: Preg Test, Ur: NEGATIVE

## 2018-10-31 LAB — HCG, QUANTITATIVE, PREGNANCY: hCG, Beta Chain, Quant, S: 2 m[IU]/mL (ref <5)

## 2018-10-31 MED ORDER — TETANUS-DIPHTH-ACELL PERTUSSIS 5-2.5-18.5 LF-MCG/0.5 IM SUSP
0.5000 mL | Freq: Once | INTRAMUSCULAR | Status: AC
  Administered 2018-10-31: 0.5 mL via INTRAMUSCULAR
  Filled 2018-10-31: qty 0.5

## 2018-10-31 MED ORDER — KETOROLAC TROMETHAMINE 30 MG/ML IJ SOLN
30.0000 mg | Freq: Once | INTRAMUSCULAR | Status: AC
  Administered 2018-10-31: 11:00:00 30 mg via INTRAVENOUS
  Filled 2018-10-31: qty 1

## 2018-10-31 MED ORDER — BACITRACIN ZINC 500 UNIT/GM EX OINT
1.0000 "application " | TOPICAL_OINTMENT | Freq: Two times a day (BID) | CUTANEOUS | Status: DC
  Administered 2018-10-31: 1 via TOPICAL
  Filled 2018-10-31: qty 28.35

## 2018-10-31 MED ORDER — ONDANSETRON HCL 4 MG/2ML IJ SOLN
4.0000 mg | Freq: Once | INTRAMUSCULAR | Status: AC
  Administered 2018-10-31: 09:00:00 4 mg via INTRAVENOUS
  Filled 2018-10-31: qty 2

## 2018-10-31 MED ORDER — IOHEXOL 300 MG/ML  SOLN
100.0000 mL | Freq: Once | INTRAMUSCULAR | Status: AC | PRN
  Administered 2018-10-31: 10:00:00 100 mL via INTRAVENOUS
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
## 2018-10-31 NOTE — Discharge Instructions (Signed)
You may take IBUPROFEN 800MG  EVERY 8 HOURS as needed for pain, as well as TYLENOL 1000MG  EVERY 6 HOURS as needed for pain. Return to ER if any vomiting, breathing problems, weakness, or severe worsening of pain.

## 2018-10-31 NOTE — ED Notes (Signed)
C-Collar removed by MD

## 2018-10-31 NOTE — ED Notes (Signed)
Bacitracin and dsd applied to left hand.

## 2018-10-31 NOTE — ED Notes (Signed)
Pt states unable to provide urine specimen at this time.  MD made aware ordered serum pregnancy test to expedite x-rays

## 2018-11-04 ENCOUNTER — Emergency Department (HOSPITAL_BASED_OUTPATIENT_CLINIC_OR_DEPARTMENT_OTHER)
Admission: EM | Admit: 2018-11-04 | Discharge: 2018-11-04 | Disposition: A | Discharge status: Home / Self Care | Payer: 59 | Attending: Emergency Medicine | Admitting: Emergency Medicine

## 2018-11-04 ENCOUNTER — Encounter (HOSPITAL_BASED_OUTPATIENT_CLINIC_OR_DEPARTMENT_OTHER): Payer: Self-pay

## 2018-11-04 ENCOUNTER — Other Ambulatory Visit: Payer: Self-pay

## 2018-11-04 DIAGNOSIS — Z91040 Latex allergy status: Secondary | ICD-10-CM | POA: Diagnosis not present

## 2018-11-04 DIAGNOSIS — Y9389 Activity, other specified: Secondary | ICD-10-CM | POA: Insufficient documentation

## 2018-11-04 DIAGNOSIS — R0789 Other chest pain: Secondary | ICD-10-CM | POA: Insufficient documentation

## 2018-11-04 DIAGNOSIS — Y998 Other external cause status: Secondary | ICD-10-CM | POA: Diagnosis not present

## 2018-11-04 DIAGNOSIS — S3991XA Unspecified injury of abdomen, initial encounter: Secondary | ICD-10-CM | POA: Diagnosis present

## 2018-11-04 DIAGNOSIS — Y92410 Unspecified street and highway as the place of occurrence of the external cause: Secondary | ICD-10-CM | POA: Diagnosis not present

## 2018-11-04 DIAGNOSIS — S301XXA Contusion of abdominal wall, initial encounter: Secondary | ICD-10-CM

## 2018-11-04 DIAGNOSIS — F419 Anxiety disorder, unspecified: Secondary | ICD-10-CM | POA: Diagnosis not present

## 2018-11-04 LAB — CBC
HCT: 37.6 % (ref 36.0–46.0)
Hemoglobin: 12 g/dL (ref 12.0–15.0)
MCH: 30 pg (ref 26.0–34.0)
MCHC: 31.9 g/dL (ref 30.0–36.0)
MCV: 94 fL (ref 80.0–100.0)
Platelets: 141 10*3/uL — ABNORMAL LOW (ref 150–400)
RBC: 4 MIL/uL (ref 3.87–5.11)
RDW: 13.5 % (ref 11.5–15.5)
WBC: 5.9 10*3/uL (ref 4.0–10.5)
nRBC: 0 % (ref 0.0–0.2)

## 2018-11-04 MED ORDER — HYDROXYZINE HCL 25 MG PO TABS: 25.0000 mg | ORAL_TABLET | Freq: Three times a day (TID) | ORAL | 0 refills | Status: DC | PRN
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
## 2018-11-04 NOTE — Discharge Instructions (Addendum)
Follow-up with your primary care doctor as needed
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
## 2018-11-10 ENCOUNTER — Telehealth: Payer: Self-pay | Admitting: Certified Nurse Midwife

## 2018-11-10 DIAGNOSIS — R7989 Other specified abnormal findings of blood chemistry: Secondary | ICD-10-CM | POA: Insufficient documentation
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
## 2018-11-11 ENCOUNTER — Ambulatory Visit (INDEPENDENT_AMBULATORY_CARE_PROVIDER_SITE_OTHER): Payer: 59 | Admitting: Family Medicine

## 2018-11-11 ENCOUNTER — Ambulatory Visit (INDEPENDENT_AMBULATORY_CARE_PROVIDER_SITE_OTHER): Payer: 59 | Admitting: Physician Assistant

## 2018-11-11 ENCOUNTER — Other Ambulatory Visit: Payer: Self-pay

## 2018-11-11 ENCOUNTER — Encounter: Payer: Self-pay | Admitting: Family Medicine

## 2018-11-11 ENCOUNTER — Encounter: Payer: Self-pay | Admitting: Physician Assistant

## 2018-11-11 VITALS — BP 122/82 | HR 72 | Temp 98.4°F | Ht 62.0 in | Wt 252.2 lb

## 2018-11-11 DIAGNOSIS — M25572 Pain in left ankle and joints of left foot: Secondary | ICD-10-CM | POA: Diagnosis not present

## 2018-11-11 DIAGNOSIS — F419 Anxiety disorder, unspecified: Secondary | ICD-10-CM | POA: Diagnosis not present

## 2018-11-11 DIAGNOSIS — S060XAA Concussion with loss of consciousness status unknown, initial encounter: Secondary | ICD-10-CM | POA: Insufficient documentation

## 2018-11-11 DIAGNOSIS — S060X9A Concussion with loss of consciousness of unspecified duration, initial encounter: Secondary | ICD-10-CM | POA: Insufficient documentation

## 2018-11-11 DIAGNOSIS — S060X0A Concussion without loss of consciousness, initial encounter: Secondary | ICD-10-CM | POA: Diagnosis not present

## 2018-11-11 MED ORDER — CYCLOBENZAPRINE HCL 10 MG PO TABS: 10.0000 mg | ORAL_TABLET | Freq: Three times a day (TID) | ORAL | 0 refills | Status: DC | PRN

## 2018-11-11 NOTE — Patient Instructions (Signed)
CoQ10 200mg  daily Turmeric 500mg  daily Fish oil 2 grams daily See me in 2 weeks

## 2018-11-11 NOTE — Assessment & Plan Note (Signed)
Notably the patient does have a mild concussion.  We will put on light duty.  Do believe the patient will do well.  We discussed discontinuing some of the other medications.  We discussed over-the-counter medications that could be beneficial as well.  Patient is to increase activity overall.  Follow-up again in 4 weeks

## 2018-11-11 NOTE — Patient Instructions (Signed)
It was great to meet you!  Continue ibuprofen to help with your pain.  Flexeril muscle relaxer for your spasms.   Ice is your friend! Especially for your breast.  Please go to the concussion clinic.  Wrap your ankle and keep it stabilized until you see sports medicine.  Take care,  Jarold Motto PA-C
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
## 2018-11-26 ENCOUNTER — Ambulatory Visit: Payer: 59 | Admitting: Family Medicine

## 2018-11-26 ENCOUNTER — Other Ambulatory Visit: Payer: Self-pay

## 2018-11-26 ENCOUNTER — Encounter: Payer: Self-pay | Admitting: Family Medicine

## 2018-11-26 VITALS — BP 102/62 | HR 90 | Ht 62.0 in | Wt 250.0 lb

## 2018-11-26 DIAGNOSIS — S134XXD Sprain of ligaments of cervical spine, subsequent encounter: Secondary | ICD-10-CM

## 2018-11-26 DIAGNOSIS — S134XXA Sprain of ligaments of cervical spine, initial encounter: Secondary | ICD-10-CM | POA: Diagnosis not present

## 2018-11-26 DIAGNOSIS — S060X0D Concussion without loss of consciousness, subsequent encounter: Secondary | ICD-10-CM | POA: Diagnosis not present

## 2018-11-26 DIAGNOSIS — M545 Low back pain, unspecified: Secondary | ICD-10-CM

## 2018-11-26 MED ORDER — MELOXICAM 15 MG PO TABS: 15.0000 mg | ORAL_TABLET | Freq: Every day | ORAL | 0 refills | Status: DC

## 2018-11-26 MED ORDER — GABAPENTIN 100 MG PO CAPS: 200.0000 mg | ORAL_CAPSULE | Freq: Every day | ORAL | 0 refills | Status: DC
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
## 2018-11-26 NOTE — Patient Instructions (Addendum)
See me gain in 3 weeks Use Meloxicam daily for 10 days and then as needed Gabapentin at night Physical therapy will be calling you

## 2018-11-26 NOTE — Assessment & Plan Note (Signed)
Patient will return in 2 weeks.  Seems to be doing significantly better with a concussion.  Patient is still somewhat emotional which seems to be not like her baseline.  Follow-up again 2 weeks

## 2018-12-03 ENCOUNTER — Encounter: Payer: Self-pay | Admitting: Physical Therapy

## 2018-12-03 ENCOUNTER — Other Ambulatory Visit: Payer: Self-pay

## 2018-12-03 ENCOUNTER — Ambulatory Visit: Payer: 59 | Admitting: Physical Therapy

## 2018-12-03 DIAGNOSIS — M542 Cervicalgia: Secondary | ICD-10-CM

## 2018-12-03 DIAGNOSIS — M545 Low back pain, unspecified: Secondary | ICD-10-CM

## 2018-12-03 DIAGNOSIS — M62830 Muscle spasm of back: Secondary | ICD-10-CM | POA: Diagnosis not present
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
## 2018-12-03 NOTE — Patient Instructions (Signed)
Access Code: [email or web address]  URL: [email or web address]  Date: 12/03/2018  Prepared by: Lyndee Hensen   Exercises Seated Cervical Retraction - 10 reps - 1 sets - 3x daily Seated Cervical Sidebending Stretch - 3 reps - 30 hold - 2x daily Seated Cervical Retraction and Rotation - 10 reps - 1 sets - 2x daily Standing Backward Shoulder Rolls - 10 reps - 1 sets - 3x daily Seated Scapular Retraction - 10 reps - 1 sets - 2x daily Supine Single Knee to Chest - 3 reps - 30 hold - 2x daily Child's Pose Stretch - 3 reps - 30 hold - 2x daily

## 2018-12-08 ENCOUNTER — Other Ambulatory Visit: Payer: Self-pay | Admitting: *Deleted

## 2018-12-08 MED ORDER — VITAMIN D (ERGOCALCIFEROL) 1.25 MG (50000 UNIT) PO CAPS: 50000.0000 [IU] | ORAL_CAPSULE | ORAL | 0 refills | Status: DC

## 2018-12-08 NOTE — Telephone Encounter (Signed)
Medication refill request: Vitamin D  Last AEX:  08-29-2018 DL Next AEX: not currently scheduled  Last MMG (if hormonal medication request): n/a Refill authorized: Today, please advise.   Medication pended for #12, 0RF. Please refill if appropriate.

## 2018-12-09 ENCOUNTER — Other Ambulatory Visit: Payer: Self-pay

## 2018-12-09 ENCOUNTER — Ambulatory Visit: Payer: 59 | Admitting: Physical Therapy

## 2018-12-09 ENCOUNTER — Encounter: Payer: Self-pay | Admitting: Physical Therapy

## 2018-12-09 DIAGNOSIS — M542 Cervicalgia: Secondary | ICD-10-CM

## 2018-12-09 DIAGNOSIS — M62830 Muscle spasm of back: Secondary | ICD-10-CM | POA: Diagnosis not present

## 2018-12-09 DIAGNOSIS — M545 Low back pain, unspecified: Secondary | ICD-10-CM

## 2018-12-09 NOTE — Patient Instructions (Signed)
Sleeping on Back  Place pillow under knees. A pillow with cervical support and a roll around waist are also helpful. Copyright  VHI. All rights reserved.  Sleeping on Side Place pillow between knees. Use cervical support under neck and a roll around waist as needed. Copyright  VHI. All rights reserved.   Sleeping on Stomach   If this is the only desirable sleeping position, place pillow under lower legs, and under stomach or chest as needed.  Posture - Sitting   Sit upright, head facing forward. Try using a roll to support lower back. Keep shoulders relaxed, and avoid rounded back. Keep hips level with knees. Avoid crossing legs for long periods. Stand to Sit / Sit to Stand   To sit: Bend knees to lower self onto front edge of chair, then scoot back on seat. To stand: Reverse sequence by placing one foot forward, and scoot to front of seat. Use rocking motion to stand up.   Work Height and Reach  Ideal work height is no more than 2 to 4 inches below elbow level when standing, and at elbow level when sitting. Reaching should be limited to arm's length, with elbows slightly bent.  Bending  Bend at hips and knees, not back. Keep feet shoulder-width apart.    Posture - Standing   Good posture is important. Avoid slouching and forward head thrust. Maintain curve in low back and align ears over shoul- ders, hips over ankles.  Alternating Positions   Alternate tasks and change positions frequently to reduce fatigue and muscle tension. Take rest breaks. Computer Work   Position work to face forward. Use proper work and seat height. Keep shoulders back and down, wrists straight, and elbows at right angles. Use chair that provides full back support. Add footrest and lumbar roll as needed.  Getting Into / Out of Car  Lower self onto seat, scoot back, then bring in one leg at a time. Reverse sequence to get out.  Dressing  Lie on back to pull socks or slacks over feet, or sit  and bend leg while keeping back straight.    Housework - Sink  Place one foot on ledge of cabinet under sink when standing at sink for prolonged periods.   Pushing / Pulling  Pushing is preferable to pulling. Keep back in proper alignment, and use leg muscles to do the work.  Deep Squat   Squat and lift with both arms held against upper trunk. Tighten stomach muscles without holding breath. Use smooth movements to avoid jerking.  Avoid Twisting   Avoid twisting or bending back. Pivot around using foot movements, and bend at knees if needed when reaching for articles.  Carrying Luggage   Distribute weight evenly on both sides. Use a cart whenever possible. Do not twist trunk. Move body as a unit.   Lifting Principles .Maintain proper posture and head alignment. .Slide object as close as possible before lifting. .Move obstacles out of the way. .Test before lifting; ask for help if too heavy. .Tighten stomach muscles without holding breath. .Use smooth movements; do not jerk. .Use legs to do the work, and pivot with feet. .Distribute the work load symmetrically and close to the center of trunk. .Push instead of pull whenever possible.   Ask For Help   Ask for help and delegate to others when possible. Coordinate your movements when lifting together, and maintain the low back curve.  Log Roll   Lying on back, bend left knee and place left   arm across chest. Roll all in one movement to the right. Reverse to roll to the left. Always move as one unit. Housework - Sweeping  Use long-handled equipment to avoid stooping.   Housework - Wiping  Position yourself as close as possible to reach work surface. Avoid straining your back.  Laundry - Unloading Wash   To unload small items at bottom of washer, lift leg opposite to arm being used to reach.  [place] close to area to be raked. Use arm movements to do the work. Keep back straight and avoid  twisting.     Cart  When reaching into cart with one arm, lift opposite leg to keep back straight.   Getting Into / Out of Bed  Lower self to lie down on one side by raising legs and lowering head at the same time. Use arms to assist moving without twisting. Bend both knees to roll onto back if desired. To sit up, start from lying on side, and use same move-ments in reverse. Housework - Vacuuming  Hold the vacuum with arm held at side. Step back and forth to move it, keeping head up. Avoid twisting.   Laundry - IT consultant so that bending and twisting can be avoided.   Laundry - Unloading Dryer  Squat down to reach into clothes dryer or use a reacher.  Gardening - Weeding / Probation officer or Kneel. Knee pads may be helpful.                   TENS UNIT: This is helpful for muscle pain and spasm.   Search and Purchase a TENS 7000 2nd edition at [employer] It should be less than $30.     TENS unit instructions: Do not shower or bathe with the unit on Turn the unit off before removing electrodes or batteries If the electrodes lose stickiness add a drop of water to the electrodes after they are disconnected from the unit and place on plastic sheet. If you continued to have difficulty, call the TENS unit company to purchase more electrodes. Do not apply lotion on the skin area prior to use. Make sure the skin is clean and dry as this will help prolong the life of the electrodes. After use, always check skin for unusual red areas, rash or other skin difficulties. If there are any skin problems, does not apply electrodes to the same area. Never remove the electrodes from the unit by pulling the wires. Do not use the TENS unit or electrodes other than as directed. Do not change electrode placement without consultating your therapist or physician. Keep 2 fingers width between each electrode. Wear time ratio is 2:1, on to off times.     For example on for 30 minutes off for 15 minutes and then on for 30 minutes off for 15 minutes

## 2018-12-09 NOTE — Therapy (Signed)
[facility] [address] [place], [place], [address] Phone: [phone]   Fax:  [phone]  Physical Therapy Treatment  Patient Details  Name: Julia Schultz MRN: [id number] Date of Birth: 1981/04/25 Referring Provider (PT): Terrilee FilesZach Smith   Encounter Date: 12/09/2018  PT End of Session - 12/09/18 0945    Visit Number  2    Number of Visits  12    Date for PT Re-Evaluation  01/14/19    Authorization Type  UHC    PT Start Time  0903    PT Stop Time  0952    PT Time Calculation (min)  49 min    Activity Tolerance  Patient tolerated treatment well    Behavior During Therapy  [facility] for tasks assessed/performed       Past Medical History:  Diagnosis Date  . 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 Anemia   . Chronic cholecystitis with calculus   . Complication of anesthesia    woke as a child during procedure  . GERD (gastroesophageal reflux disease)   . Headache    migraines  . Menorrhagia   . Thrombocytopenia (HCC) 2005    Past Surgical History:  Procedure Laterality Date  . BREAST SURGERY  2003   breast reduction   . CESAREAN SECTION     x 3  . CHOLECYSTECTOMY N/A 08/10/2015   Procedure: LAPAROSCOPIC CHOLECYSTECTOMY WITH INTRAOPERATIVE CHOLANGIOGRAM;  Surgeon: Manus RuddMatthew Tsuei, MD;  Location: [facility] OR;  Service: General;  Laterality: N/A;  . COLPOSCOPY  2018  . FOOT SURGERY Left child 4th grade   correct arch  . TUBAL LIGATION Bilateral 2010    There were no vitals filed for this visit.  Subjective Assessment - 12/09/18 0907    Subjective  Pt reports she is able to do a little more since last visit, but her pain is still "irritating and a constant nag".  She is still on light duty at work.    Patient Stated Goals  Decreased pain in neck and back    Currently in Pain?  Yes    Pain Score  3     Pain Location  Back   neck to low back   Pain Orientation  Right;Left    Pain Descriptors / Indicators   Nagging    Aggravating Factors   work duties and chores    Pain Relieving Factors  epsom salt bath, medication         [facility] PT Assessment - 12/09/18 0001      Assessment   Medical Diagnosis  Neck and Back Pain    Referring Provider (PT)  Terrilee FilesZach Smith    Hand Dominance  Right    Next MD Visit  12/17/18    Prior Therapy  no        [facility] Adult PT Treatment/Exercise - 12/09/18 0001      Neck Exercises: Seated   Neck Retraction  1 rep;5 secs    Cervical Rotation  Right;Left;5 reps    Shoulder Rolls  5 reps;Backwards    Other Seated Exercise  scap squeeze x 5 sec x 10 with noodle ;       Lumbar Exercises: Stretches   Single Knee to Chest Stretch  Right;Left;2 reps;20 seconds      Lumbar Exercises: Aerobic   Nustep  L4: 5.5 min (arms and legs)       Lumbar Exercises: Sidelying   Other Sidelying Lumbar  Exercises  supine to sit x 2 reps each direction       Lumbar Exercises: Quadruped   [place]  5 reps    Other Quadruped Lumbar Exercises  child's pose x 15 sec x 2 reps       Modalities   Modalities  Electrical Stimulation;Moist Heat      Moist Heat Therapy   Number Minutes Moist Heat  12 Minutes    Moist Heat Location  Cervical;Lumbar Spine      Electrical Stimulation   Electrical Stimulation Location  upper trap and cervical paraspinals    Electrical Stimulation Action  IFC    Electrical Stimulation Parameters  to tolerance     Electrical Stimulation Goals  Pain;Tone      Neck Exercises: Stretches   Upper Trapezius Stretch  Right;Left;2 reps;20 seconds    Levator Stretch  Right;Left;2 reps;20 seconds    Other Neck Stretches  mid level doorway stretch x 30 sec x 1 rep;  rhomboid stretch holding door frame with arms crossed x 20 sec             PT Education - 12/09/18 0944    Education Details  TENS, posture and body mechanics.    Person(s) Educated  Patient    Methods  Explanation;Handout    Comprehension  Verbalized understanding       PT Short  Term Goals - 12/03/18 1010      PT SHORT TERM GOAL #1   Title  Pt to be independent with initial HEP    Time  6    Period  Weeks    Status  New    Target Date  01/14/19      PT SHORT TERM GOAL #2   Title  Pt to report decreased pain in neck to 5/10 with activity        PT Long Term Goals - 12/03/18 1011      PT LONG TERM GOAL #1   Title  Pt to be independent with long term HEP    Time  6    Period  Weeks    Status  New    Target Date  01/14/19      PT LONG TERM GOAL #2   Title  Pt to report decreased pain to 0-2/10 in neck and back, to improve ability for work duties.    Time  6    Period  Weeks    Status  New    Target Date  01/14/19      PT LONG TERM GOAL #3   Title  Pt to demo soft tissue restrictions in cervical region to be WNL, to improve pain and mobility.    Time  6    Period  Weeks    Status  New    Target Date  01/14/19      PT LONG TERM GOAL #4   Title  Pt to demo cervical and lumbar ROM to be [facility] to improve ability for driving and IADLS.    Time  6    Period  Weeks    Status  New    Target Date  01/14/19            Plan - 12/09/18 [id number]    Clinical Impression Statement  Pt reported dizziness with Rt levator neck stretch (Lt lateral flexion with rotation), log roll, and looking up; pt will benefit from further eval of dizziness symptoms. Pt required minor cues on exercises for correct  form.  Pt reported reduction of pain with use of MHP / estim at end of session.  Goals are ongoing at this time.    Examination-Participation Restrictions  Cleaning;Community Activity;Driving    Rehab Potential  Good    PT Frequency  2x / week    PT Duration  6 weeks    PT Treatment/Interventions  ADLs/Self Care Home Management;Cryotherapy;Electrical Stimulation;Iontophoresis 4mg /ml Dexamethasone;Moist Heat;Therapeutic activities;Ultrasound;Traction;Therapeutic exercise;Neuromuscular re-education;Patient/family education;Taping;Dry needling;Passive range of  motion;Manual techniques;Vestibular;Visual/perceptual remediation/compensation;Joint Manipulations;Spinal Manipulations;Gait training;Stair training;Functional mobility training    PT Next Visit Plan  continue postural strengthening/ stretching; back care education    PT Home Exercise Plan  code: [email or web address]    DGLOVFIEP and Agree with Plan of Care  Patient       Patient will benefit from skilled therapeutic intervention in order to improve the following deficits and impairments:  Decreased activity tolerance, Decreased strength, Pain, Decreased mobility, Increased muscle spasms, Improper body mechanics, Decreased range of motion, Impaired flexibility  Visit Diagnosis: 1. Cervicalgia   2. Acute bilateral low back pain without sciatica   3. Muscle spasm of back        Problem List Patient Active Problem List   Diagnosis Date Noted  . Whiplash injury 11/26/2018  . Mild concussion 11/11/2018  . Elevated LFTs 11/10/2018  . Thrombocytopenia ([place]) 08/16/2017  . Vitamin D deficiency 08/16/2017   Kerin Perna, PTA 12/09/18 10:00 AM  [facility] [place], [place], [address] Phone: [phone]   Fax:  [phone]  Name: Braelynn Benning MRN: [id number] Date of Birth: 15-Sep-1980

## 2018-12-11 ENCOUNTER — Ambulatory Visit: Payer: 59 | Admitting: Physical Therapy

## 2018-12-11 ENCOUNTER — Encounter: Payer: Self-pay | Admitting: Physical Therapy

## 2018-12-11 ENCOUNTER — Other Ambulatory Visit: Payer: Self-pay

## 2018-12-11 DIAGNOSIS — M545 Low back pain, unspecified: Secondary | ICD-10-CM

## 2018-12-11 DIAGNOSIS — M62830 Muscle spasm of back: Secondary | ICD-10-CM

## 2018-12-11 DIAGNOSIS — M542 Cervicalgia: Secondary | ICD-10-CM | POA: Diagnosis not present

## 2018-12-11 NOTE — Therapy (Signed)
[facility] [address] [place], [place], [address] Phone: [phone]   Fax:  [phone]  Physical Therapy Treatment  Patient Details  Name: Julia Schultz MRN: [id number] Date of Birth: Apr 11, 1981 Referring Provider (PT): Terrilee FilesZach Smith   Encounter Date: 12/11/2018  PT End of Session - 12/11/18 1610    Visit Number  3    Number of Visits  12    Date for PT Re-Evaluation  01/14/19    Authorization Type  UHC    PT Start Time  1607    PT Stop Time  1658    PT Time Calculation (min)  51 min       Past Medical History:  Diagnosis Date  . 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 Anemia   . Chronic cholecystitis with calculus   . Complication of anesthesia    woke as a child during procedure  . GERD (gastroesophageal reflux disease)   . Headache    migraines  . Menorrhagia   . Thrombocytopenia (HCC) 2005    Past Surgical History:  Procedure Laterality Date  . BREAST SURGERY  2003   breast reduction   . CESAREAN SECTION     x 3  . CHOLECYSTECTOMY N/A 08/10/2015   Procedure: LAPAROSCOPIC CHOLECYSTECTOMY WITH INTRAOPERATIVE CHOLANGIOGRAM;  Surgeon: Manus RuddMatthew Tsuei, MD;  Location: [facility] OR;  Service: General;  Laterality: N/A;  . COLPOSCOPY  2018  . FOOT SURGERY Left child 4th grade   correct arch  . TUBAL LIGATION Bilateral 2010    There were no vitals filed for this visit.  Subjective Assessment - 12/11/18 1610    Subjective  Pt reports she feels about the same as last visit. She rested yesterday and did some of the exercises.    Currently in Pain?  Yes    Pain Score  3     Pain Location  Back   neck to lower back   Pain Orientation  Right;Left    Pain Descriptors / Indicators  Nagging    Aggravating Factors   work duties and chores    Pain Relieving Factors  epsom salt bath, medication         [facility] PT Assessment - 12/11/18 0001      Assessment   Medical Diagnosis  Neck and Back Pain    Referring Provider (PT)  Terrilee FilesZach Smith    Hand Dominance  Right    Next MD Visit  12/17/18    Prior Therapy  no        [facility] Adult PT Treatment/Exercise - 12/11/18 0001      Neck Exercises: Supine   Shoulder Flexion  10 reps;Both   overhead pull; red band   Other Supine Exercise  bilat shoulder horiz abdct with red band;  diagonals x 10 each with red band each side. bilat shoulder ER with red band x 10        Lumbar Exercises: Stretches   Passive Hamstring Stretch  Right;Left;2 reps;30 seconds    Single Knee to Chest Stretch  Right;Left;2 reps;20 seconds      Lumbar Exercises: Aerobic   Nustep  L4: 6.5 min (arms and legs)       Moist Heat Therapy   Moist Heat Location  Cervical;Lumbar Spine   thoracic     Electrical Stimulation   Electrical Stimulation Location  thoracic paraspinals / cervical paraspinals     Electrical Stimulation Action  IFC  Electrical Stimulation Parameters  to tolerance     Electrical Stimulation Goals  Pain;Tone      Neck Exercises: Stretches   Other Neck Stretches  mid and high level doorway stretch x 15 sec x 2 rep;  rhomboid stretch holding door frame with arms crossed x 20 sec.  R/L cervical lateral flexion x 15 sec each side.               PT Short Term Goals - 12/03/18 1010      PT SHORT TERM GOAL #1   Title  Pt to be independent with initial HEP    Time  6    Period  Weeks    Status  New    Target Date  01/14/19      PT SHORT TERM GOAL #2   Title  Pt to report decreased pain in neck to 5/10 with activity        PT Long Term Goals - 12/03/18 1011      PT LONG TERM GOAL #1   Title  Pt to be independent with long term HEP    Time  6    Period  Weeks    Status  New    Target Date  01/14/19      PT LONG TERM GOAL #2   Title  Pt to report decreased pain to 0-2/10 in neck and back, to improve ability for work duties.    Time  6    Period  Weeks    Status  New    Target Date  01/14/19      PT LONG TERM GOAL #3   Title  Pt  to demo soft tissue restrictions in cervical region to be WNL, to improve pain and mobility.    Time  6    Period  Weeks    Status  New    Target Date  01/14/19      PT LONG TERM GOAL #4   Title  Pt to demo cervical and lumbar ROM to be [facility] to improve ability for driving and IADLS.    Time  6    Period  Weeks    Status  New    Target Date  01/14/19            Plan - 12/11/18 1652    Clinical Impression Statement  Pt tolerated all exercises well, reporting slight increase in tightness with supine scap stabilization exercises; reduced with use of MHP/estim at end of session.  Pt continues to report dizziness with looking up and rolling in bed; will benefit from assessment next visit. Goals are ongoing at this time.    Examination-Participation Restrictions  Cleaning;Community Activity;Driving    Rehab Potential  Good    PT Frequency  2x / week    PT Duration  6 weeks    PT Treatment/Interventions  ADLs/Self Care Home Management;Cryotherapy;Electrical Stimulation;Iontophoresis 4mg /ml Dexamethasone;Moist Heat;Therapeutic activities;Ultrasound;Traction;Therapeutic exercise;Neuromuscular re-education;Patient/family education;Taping;Dry needling;Passive range of motion;Manual techniques;Vestibular;Visual/perceptual remediation/compensation;Joint Manipulations;Spinal Manipulations;Gait training;Stair training;Functional mobility training    PT Next Visit Plan  continue postural strengthening/ stretching; back care education    PT Home Exercise Plan  code: [email or web address]    [email or web address] and Agree with Plan of Care  Patient       Patient will benefit from skilled therapeutic intervention in order to improve the following deficits and impairments:  Decreased activity tolerance, Decreased strength, Pain, Decreased mobility, Increased muscle spasms, Improper body mechanics, Decreased range of motion, Impaired flexibility  Visit  Diagnosis: 1. Cervicalgia   2. Acute bilateral low back pain without  sciatica   3. Muscle spasm of back        Problem List Patient Active Problem List   Diagnosis Date Noted  . Whiplash injury 11/26/2018  . Mild concussion 11/11/2018  . Elevated LFTs 11/10/2018  . Thrombocytopenia (HCC) 08/16/2017  . Vitamin D deficiency 08/16/2017   Mayer CamelJennifer Carlson-Long, PTA 12/11/18 5:59 PM  [facility] [address] [place], [place], [address] Phone: [phone]   Fax:  [phone]  Name: Julia Schultz MRN: [id number] Date of Birth: 07-09-1980

## 2018-12-11 NOTE — Patient Instructions (Signed)
Over Head Pull: Narrow Grip     [phone]   On back, knees bent, feet flat, band across thighs, elbows straight but relaxed. Pull hands apart (start). Keeping elbows straight, bring arms up and over head, hands toward floor. Keep pull steady on band. Hold momentarily. Return slowly, keeping pull steady, back to start. Repeat __10_ times. Band color ___red___   Side Pull: Double Arm   On back, knees bent, feet flat. Arms perpendicular to body, shoulder level, elbows straight but relaxed. Pull arms out to sides, elbows straight. Resistance band comes across collarbones, hands toward floor. Hold momentarily. Slowly return to starting position. Repeat _10__ times. Band color __red___   Elmer Picker   On back, knees bent, feet flat, left hand on left hip, right hand above left. Pull right arm DIAGONALLY (hip to shoulder) across chest. Bring right arm along head toward floor. Hold momentarily. Slowly return to starting position. Repeat ___ times. Do with left arm. Band color __red____   Shoulder Rotation: Double Arm   On back, knees bent, feet flat, elbows tucked at sides, bent 90, hands palms up. Pull hands apart and down toward floor, keeping elbows near sides. Hold momentarily. Slowly return to starting position. Repeat _10__ times. Band color ___red___

## 2018-12-15 ENCOUNTER — Ambulatory Visit: Payer: 59 | Admitting: Physical Therapy

## 2018-12-15 ENCOUNTER — Encounter (INDEPENDENT_AMBULATORY_CARE_PROVIDER_SITE_OTHER): Payer: Self-pay

## 2018-12-15 ENCOUNTER — Encounter: Payer: Self-pay | Admitting: Physical Therapy

## 2018-12-15 ENCOUNTER — Other Ambulatory Visit: Payer: Self-pay

## 2018-12-15 DIAGNOSIS — M62830 Muscle spasm of back: Secondary | ICD-10-CM

## 2018-12-15 DIAGNOSIS — M542 Cervicalgia: Secondary | ICD-10-CM | POA: Diagnosis not present

## 2018-12-15 DIAGNOSIS — R42 Dizziness and giddiness: Secondary | ICD-10-CM

## 2018-12-15 DIAGNOSIS — M545 Low back pain, unspecified: Secondary | ICD-10-CM

## 2018-12-15 NOTE — Patient Instructions (Signed)
Access Code: [email or web address]  URL: [email or web address]  Date: 12/15/2018  Prepared by: Faustino Congress   Exercises  Wide Stance with Head Rotation on Foam Pad - 10 reps - 3 sets - 2x daily - 7x weekly  Wide Stance with Head Nods on Foam Pad - 10 reps - 3 sets - 2x daily - 7x weekly  Romberg Stance Eyes Closed on Foam Pad - 5 reps - 1 sets - 10-15 sec hold - 2x daily - 7x weekly  Standing Gaze Stabilization with Head Rotation - 1 sets - 30 Seconds - 2x daily - 7x weekly  Standing Gaze Stabilization with Head Nod - 1 reps - 30 seconds - 2x daily - 7x weekly  Patient Education  Gaze Stabilization

## 2018-12-15 NOTE — Therapy (Signed)
[facility] [address] [place], [place], [address] Phone: [phone]   Fax:  [phone]  Physical Therapy Evaluation  Patient Details  Name: Julia Schultz MRN: [id number] Date of Birth: January 31, 1981 Referring Provider (PT): Terrilee FilesZach Smith   Encounter Date: 12/15/2018  PT End of Session - 12/15/18 1403    Visit Number  4    Number of Visits  12    Date for PT Re-Evaluation  01/14/19    Authorization Type  UHC    PT Start Time  1037    PT Stop Time  1123    PT Time Calculation (min)  46 min    Activity Tolerance  Patient tolerated treatment well    Behavior During Therapy  [facility] for tasks assessed/performed       Past Medical History:  Diagnosis Date  . 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 Anemia   . Chronic cholecystitis with calculus   . Complication of anesthesia    woke as a child during procedure  . GERD (gastroesophageal reflux disease)   . Headache    migraines  . Menorrhagia   . Thrombocytopenia (HCC) 2005    Past Surgical History:  Procedure Laterality Date  . BREAST SURGERY  2003   breast reduction   . CESAREAN SECTION     x 3  . CHOLECYSTECTOMY N/A 08/10/2015   Procedure: LAPAROSCOPIC CHOLECYSTECTOMY WITH INTRAOPERATIVE CHOLANGIOGRAM;  Surgeon: Manus RuddMatthew Tsuei, MD;  Location: [facility] OR;  Service: General;  Laterality: N/A;  . COLPOSCOPY  2018  . FOOT SURGERY Left child 4th grade   correct arch  . TUBAL LIGATION Bilateral 2010    There were no vitals filed for this visit.   Subjective Assessment - 12/15/18 1037    Subjective  "I did not want to get up today."    Patient Stated Goals  Decreased pain in neck and back    Currently in Pain?  Yes    Pain Score  3     Pain Location  Shoulder   into back   Pain Orientation  Right;Left    Pain Descriptors / Indicators  Nagging    Pain Onset  1 to 4 weeks ago    Pain Frequency  Constant               Vestibular Assessment  - 12/15/18 1038      Vestibular Assessment   General Observation  no symptoms at rest      Symptom Behavior   Subjective history of current problem  looking up and overhead - room spinning; when going to lie down or returning to sitting from bending over    Type of Dizziness   Spinning;Lightheadedness    Frequency of Dizziness  with any provoking motions    Duration of Dizziness  seconds    Symptom Nature  Motion provoked;Positional    Aggravating Factors  Lying supine;Looking up to the ceiling;Forward bending   return to sitting from forward bending   Relieving Factors  --   spontaneuos resolve   Progression of Symptoms  Better      Oculomotor Exam   Oculomotor Alignment  Normal    Spontaneous  Absent    Gaze-induced   Absent    Smooth Pursuits  Intact    Saccades  Intact      Vestibulo-Ocular Reflex   VOR 1 Head Only (x 1 viewing)  difficulty maintaining gaze, otherwise no  symptoms - pt self-restricting      Positional Testing   Dix-Hallpike  Dix-Hallpike Right;Dix-Hallpike Left   c/o pressure with all positional testing   Sidelying Test  Sidelying Right;Sidelying Left    Horizontal Canal Testing  Horizontal Canal Right;Horizontal Canal Left      Dix-Hallpike Right   Dix-Hallpike Right Duration  none    Dix-Hallpike Right Symptoms  No nystagmus      Dix-Hallpike Left   Dix-Hallpike Left Duration  none    Dix-Hallpike Left Symptoms  No nystagmus      Sidelying Right   Sidelying Right Duration  none    Sidelying Right Symptoms  No nystagmus      Sidelying Left   Sidelying Left Duration  none    Sidelying Left Symptoms  No nystagmus      Horizontal Canal Right   Horizontal Canal Right Duration  none    Horizontal Canal Right Symptoms  Normal      Horizontal Canal Left   Horizontal Canal Left Duration  none    Horizontal Canal Left Symptoms  Normal          Objective measurements completed on examination: See above findings.       Vestibular  Treatment/Exercise - 12/15/18 1128      Vestibular Treatment/Exercise   Vestibular Treatment Provided  Gaze    Gaze Exercises  X1 Viewing Horizontal;X1 Viewing Vertical      X1 Viewing Horizontal   Foot Position  apart    Time  --   30 sec   Reps  1    Comments  symptoms up to 3/10      X1 Viewing Vertical   Foot Position  apart    Time  --   30 sec   Reps  1    Comments  symptoms up to 3/10; incr difficulty and slowed speed         Balance Exercises - 12/15/18 1129      Balance Exercises: Standing   Standing Eyes Opened  Wide (BOA);Head turns;Foam/compliant surface    Standing Eyes Closed  Foam/compliant surface;Narrow base of support (BOS);3 reps;10 secs   increased sway       PT Education - 12/15/18 1403    Education Details  vestibular HEP    Person(s) Educated  Patient    Methods  Explanation;Demonstration;Handout    Comprehension  Verbalized understanding       PT Short Term Goals - 12/03/18 1010      PT SHORT TERM GOAL #1   Title  Pt to be independent with initial HEP    Time  6    Period  Weeks    Status  New    Target Date  01/14/19      PT SHORT TERM GOAL #2   Title  Pt to report decreased pain in neck to 5/10 with activity        PT Long Term Goals - 12/15/18 1407      PT LONG TERM GOAL #1   Title  Pt to be independent with long term HEP    Time  6    Period  Weeks    Status  New    Target Date  01/14/19      PT LONG TERM GOAL #2   Title  Pt to report decreased pain to 0-2/10 in neck and back, to improve ability for work duties.    Time  6    Period  Weeks  Status  New    Target Date  01/14/19      PT LONG TERM GOAL #3   Title  Pt to demo soft tissue restrictions in cervical region to be WNL, to improve pain and mobility.    Time  6    Period  Weeks    Status  New    Target Date  01/14/19      PT LONG TERM GOAL #4   Title  Pt to demo cervical and lumbar ROM to be [facility] to improve ability for driving and IADLS.    Time  6     Period  Weeks    Status  New    Target Date  01/14/19      PT LONG TERM GOAL #5   Title  report 75% improvement in vestibular symptoms for improved function    Status  New    Target Date  01/14/19             Plan - 12/15/18 1403    Clinical Impression Statement  Session today focused on vestibular assessment and establishing HEP.  Clinical findings inconsistent today and negative positional testing for BPPV.  Pt demonstrates difficulty with oculomotor activities, as well as gaze stabilization and compliant surface activities.  Pt seems to be somewhat self-limiting needing mod to max cues to progress through symptoms.  Also recommended to increase activity at home to help with symptoms.  Pt also reports continued anxiety, and recommended counseling which has already been recommended by PCP.    Examination-Participation Restrictions  Cleaning;Community Activity;Driving    Rehab Potential  Good    PT Frequency  2x / week    PT Duration  6 weeks    PT Treatment/Interventions  ADLs/Self Care Home Management;Cryotherapy;Electrical Stimulation;Iontophoresis 4mg /ml Dexamethasone;Moist Heat;Therapeutic activities;Ultrasound;Traction;Therapeutic exercise;Neuromuscular re-education;Patient/family education;Taping;Dry needling;Passive range of motion;Manual techniques;Vestibular;Visual/perceptual remediation/compensation;Joint Manipulations;Spinal Manipulations;Gait training;Stair training;Functional mobility training;Canalith Repostioning    PT Next Visit Plan  continue postural strengthening/ stretching; back care education    PT Home Exercise Plan  code: [email or web address]; Vestibular Access Code: [email or web address]    Consulted and Agree with Plan of Care  Patient       Patient will benefit from skilled therapeutic intervention in order to improve the following deficits and impairments:  Decreased activity tolerance, Decreased strength, Pain, Decreased mobility, Increased muscle spasms, Improper body mechanics,  Decreased range of motion, Impaired flexibility, Dizziness  Visit Diagnosis: 1. Cervicalgia   2. Acute bilateral low back pain without sciatica   3. Muscle spasm of back   4. Dizziness and giddiness        Problem List Patient Active Problem List   Diagnosis Date Noted  . Whiplash injury 11/26/2018  . Mild concussion 11/11/2018  . Elevated LFTs 11/10/2018  . Thrombocytopenia (HCC) 08/16/2017  . Vitamin D deficiency 08/16/2017      Clarita CraneStephanie F Jaleeya Mcnelly, PT, DPT 12/15/18 2:12 PM     [facility] [address] [place], [place], [address] Phone: [phone]   Fax:  [phone]  Name: Julia Schultz MRN: [id number] Date of Birth: August 01, 1980

## 2018-12-17 ENCOUNTER — Ambulatory Visit: Payer: 59 | Admitting: Family Medicine

## 2018-12-17 ENCOUNTER — Encounter: Payer: Self-pay | Admitting: Family Medicine

## 2018-12-17 ENCOUNTER — Other Ambulatory Visit: Payer: Self-pay

## 2018-12-17 DIAGNOSIS — S060X0D Concussion without loss of consciousness, subsequent encounter: Secondary | ICD-10-CM

## 2018-12-17 NOTE — Progress Notes (Signed)
Subjective:    I, Julia Schultz, am serving as a scribe for Dr. Hulan Saas.  Chief Complaint: Julia Schultz, DOB: [birth year], is a 38 y.o. female who presents for head injury. Continues to have headaches that occur with stress. Is working 6 hours a day. Drives the bus. Has not had any symptoms during work. Has been going to physical therapy. Has dizziness with certain movements that lasts for 2 seconds. Is able to drive her own vehicle without symptoms.  No chief complaint on file.   Injury date : 10/31/2018 Visit #: 3      Concussion Self-Reported Symptom Score Symptoms rated on a scale 1-6, in last 24 hours  Headache: 2  Nausea: 0  Vomiting: 0  Balance Difficulty: 2  Dizziness: 0  Fatigue: 3  Trouble Falling Asleep: 0  Sleep More Than Usual: 0  Sleep Less Than Usual: 0   Daytime Drowsiness: 2  Photophobia: 0  Phonophobia: 0  Irritability:1  Sadness:1  Nervousness:0  Feeling More Emotional:0  Numbness or Tingling: 0  Feeling Slowed Down3  Feeling Mentally Foggy:1  Difficulty Concentrating:0  Difficulty Remembering:3  Visual Problems: 0   Total Symptom Score:18 Previous Symptom Score: 26 11/11/2018  Review of Systems:  No , visual changes, nausea, vomiting, diarrhea, constipation, dizziness, abdominal pain, skin rash, fevers, chills, night sweats, weight loss, swollen lymph nodes, body aches, joint swelling, muscle aches, chest pain, shortness of breath, mood changes.   +Headache   Review of History: Past Medical History: Past Medical History:  Diagnosis Date  . 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 Anemia   . Chronic cholecystitis with calculus   . Complication of anesthesia    woke as a child during procedure  . GERD (gastroesophageal reflux disease)   . Headache    migraines  . Menorrhagia   . Thrombocytopenia ([place]) 2005    Past Surgical History:  has a past surgical history that includes Foot surgery (Left, child 4th grade); Tubal  ligation (Bilateral, 2010); Breast surgery (2003); Cesarean section; Cholecystectomy (N/A, 08/10/2015); and Colposcopy (2018). Family History: family history includes Diabetes in her mother; Diverticulitis in her mother; Hyperlipidemia in her mother; Hypertension in her mother; Pancreatic cancer in her father. no family history of autoimmune Social History:  reports that she has never smoked. She has never used smokeless tobacco. She reports current alcohol use. She reports that she does not use drugs. Current Medications: has a current medication list which includes the following prescription(s): cholecalciferol, cyclobenzaprine, gabapentin, hydroxyzine, medroxyprogesterone, meloxicam, multiple vitamin, and vitamin d (ergocalciferol). Allergies: is allergic to banana; latex; other; and tomato.  Objective:    Physical Examination Vitals:   12/17/18 1529  BP: 110/84  Pulse: 65  SpO2: 99%   General: No apparent distress alert and oriented x3 mood and affect normal, dressed appropriately.  HEENT: Pupils equal, extraocular movements intact  Respiratory: Patient's speak in full sentences and does not appear short of breath  Cardiovascular: No lower extremity edema, non tender, no erythema  Skin: Warm dry intact with no signs of infection or rash on extremities or on axial skeleton.  Abdomen: Soft nontender  Neuro: Cranial nerves II through XII are intact, neurovascularly intact in all extremities with 2+ DTRs and 2+ pulses.  Lymph: No lymphadenopathy of posterior or anterior cervical chain or axillae bilaterally.  Gait normal with good balance and coordination.  MSK:  Non tender with full range of motion and good stability and symmetric  strength and tone of shoulders, elbows, wrist,  knee and ankles bilaterally.  Psychiatric: Oriented X3, intact recent and remote memory, judgement and insight, normal mood and affect

## 2018-12-17 NOTE — Assessment & Plan Note (Signed)
Very mild symptoms still remaining.  Patient will continue part-time or light duty at work for the next 5 days and then do a 2-week trial of regular duty.  I do think that patient will do well with this.  We discussed with patient about icing regimen, home exercise, which activities to do which wants to avoid.  I believe the patient will do extremely well and can follow-up with me again in 4 to 6 weeks

## 2018-12-17 NOTE — Patient Instructions (Signed)
Try full time  Let's do a virtual visit 2-3 weeks

## 2018-12-18 ENCOUNTER — Encounter: Payer: Self-pay | Admitting: Physical Therapy

## 2018-12-18 ENCOUNTER — Other Ambulatory Visit: Payer: Self-pay

## 2018-12-18 ENCOUNTER — Ambulatory Visit: Payer: 59 | Admitting: Physical Therapy

## 2018-12-18 DIAGNOSIS — M62830 Muscle spasm of back: Secondary | ICD-10-CM

## 2018-12-18 DIAGNOSIS — M545 Low back pain, unspecified: Secondary | ICD-10-CM

## 2018-12-18 DIAGNOSIS — M542 Cervicalgia: Secondary | ICD-10-CM

## 2018-12-18 DIAGNOSIS — R42 Dizziness and giddiness: Secondary | ICD-10-CM

## 2018-12-18 NOTE — Therapy (Signed)
[facility] [address] [place], [place], [address] Phone: [phone]   Fax:  [phone]  Physical Therapy Treatment  Patient Details  Name: Julia Schultz MRN: [id number] Date of Birth: 11-28-1980 Referring Provider (PT): Charlann Boxer   Encounter Date: 12/18/2018  PT End of Session - 12/18/18 1008    Visit Number  5    Number of Visits  12    Date for PT Re-Evaluation  01/14/19    Authorization Type  UHC    PT Start Time  1008   pt arrived late   PT Stop Time  1049    PT Time Calculation (min)  41 min    Activity Tolerance  Patient tolerated treatment well    Behavior During Therapy  [facility] for tasks assessed/performed       Past Medical History:  Diagnosis Date  . 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 Anemia   . Chronic cholecystitis with calculus   . Complication of anesthesia    woke as a child during procedure  . GERD (gastroesophageal reflux disease)   . Headache    migraines  . Menorrhagia   . Thrombocytopenia ([place]) 2005    Past Surgical History:  Procedure Laterality Date  . BREAST SURGERY  2003   breast reduction   . CESAREAN SECTION     x 3  . CHOLECYSTECTOMY N/A 08/10/2015   Procedure: LAPAROSCOPIC CHOLECYSTECTOMY WITH INTRAOPERATIVE CHOLANGIOGRAM;  Surgeon: Donnie Mesa, MD;  Location: [place];  Service: General;  Laterality: N/A;  . COLPOSCOPY  2018  . FOOT SURGERY Left child 4th grade   correct arch  . TUBAL LIGATION Bilateral 2010    There were no vitals filed for this visit.  Subjective Assessment - 12/18/18 1010    Subjective  "I was just feeling off the rest of the day."  She states "you don't realize what part of your sense you don't use until your pressed to use them".  She didn't have a chance to do the exercises since last visit.    Limitations  Sitting;Lifting;Standing;Walking;House hold activities    Patient Stated Goals  Decreased pain in neck and back    Currently in Pain?  Yes    Pain Score  4     Pain Location  Back    Pain Orientation  Mid    Pain Descriptors / Indicators  Tightness;Sore    Aggravating Factors   work duties and chores    Pain Relieving Factors  epsom salt bath, medication         [facility] PT Assessment - 12/18/18 0001      Assessment   Medical Diagnosis  Neck and Back Pain    Referring Provider (PT)  Charlann Boxer    Hand Dominance  Right    Next MD Visit  4-6 wks     Prior Therapy  no       [facility] Adult PT Treatment/Exercise - 12/18/18 0001      Self-Care   Self-Care  Other Self-Care Comments    Other Self-Care Comments   pt educated on self massage with ball to paraspinals; issued tennis ball, pt verbalized understanding      Neck Exercises: Theraband   Rows  15 reps;Red    Shoulder External Rotation  10 reps;Red   back against noodle     Neck Exercises: Supine   Other Supine Exercise  hooklying on pool noodle: 10 slow  snow angels       Lumbar Exercises: Aerobic   Nustep  L4: 5 min (arms and legs)       Moist Heat Therapy   Number Minutes Moist Heat  10 Minutes    Moist Heat Location  Lumbar Spine   thoracic spine     Electrical Stimulation   Electrical Stimulation Location  thoracic paraspinals/ levator    Electrical Stimulation Action  IFC    Electrical Stimulation Parameters   to tolerance    Electrical Stimulation Goals  Pain;Tone      Neck Exercises: Stretches   Other Neck Stretches  3 position doorway stretch x 20 sec    Other Neck Stretches  rhomboid/ neck stretch with self hug and rolling down to lap x 3 reps       Balance Exercises - 12/18/18 1041      Balance Exercises: Standing   Standing Eyes Opened  Wide (BOA);Head turns;Foam/compliant surface;2 reps;20 secs    Standing Eyes Closed  Wide (BOA);Narrow base of support (BOS);Foam/compliant surface;3 reps;10 secs   increased sway         PT Short Term Goals - 12/03/18 1010      PT SHORT TERM GOAL #1   Title  Pt to be  independent with initial HEP    Time  6    Period  Weeks    Status  New    Target Date  01/14/19      PT SHORT TERM GOAL #2   Title  Pt to report decreased pain in neck to 5/10 with activity        PT Long Term Goals - 12/15/18 1407      PT LONG TERM GOAL #1   Title  Pt to be independent with long term HEP    Time  6    Period  Weeks    Status  New    Target Date  01/14/19      PT LONG TERM GOAL #2   Title  Pt to report decreased pain to 0-2/10 in neck and back, to improve ability for work duties.    Time  6    Period  Weeks    Status  New    Target Date  01/14/19      PT LONG TERM GOAL #3   Title  Pt to demo soft tissue restrictions in cervical region to be WNL, to improve pain and mobility.    Time  6    Period  Weeks    Status  New    Target Date  01/14/19      PT LONG TERM GOAL #4   Title  Pt to demo cervical and lumbar ROM to be [facility] to improve ability for driving and IADLS.    Time  6    Period  Weeks    Status  New    Target Date  01/14/19      PT LONG TERM GOAL #5   Title  report 75% improvement in vestibular symptoms for improved function    Status  New    Target Date  01/14/19            Plan - 12/18/18 1044    Clinical Impression Statement  Pt tolerated exercises well with only mild increase in discomfort between scapula; decreased with use of estim/ MHP at end of session.  Pt reporting less symptoms of dizziness with balance exercises today.  Pt progressing towards goals.  Examination-Activity Limitations  Sleep;Bend;Squat;Caring for Others;Carry;Stand;Lift;Reach Overhead    Examination-Participation Restrictions  Cleaning;Community Activity;Driving    Rehab Potential  Good    PT Frequency  2x / week    PT Duration  6 weeks    PT Treatment/Interventions  ADLs/Self Care Home Management;Cryotherapy;Electrical Stimulation;Iontophoresis 4mg /ml Dexamethasone;Moist Heat;Therapeutic activities;Ultrasound;Traction;Therapeutic exercise;Neuromuscular  re-education;Patient/family education;Taping;Dry needling;Passive range of motion;Manual techniques;Vestibular;Visual/perceptual remediation/compensation;Joint Manipulations;Spinal Manipulations;Gait training;Stair training;Functional mobility training;Canalith Repostioning    PT Next Visit Plan  continue postural strengthening/ stretching; back care education    PT Home Exercise Plan  code: [email or web address]; Vestibular Access Code: [email or web address]    Consulted and Agree with Plan of Care  Patient       Patient will benefit from skilled therapeutic intervention in order to improve the following deficits and impairments:  Decreased activity tolerance, Decreased strength, Pain, Decreased mobility, Increased muscle spasms, Improper body mechanics, Decreased range of motion, Impaired flexibility, Dizziness  Visit Diagnosis: 1. Cervicalgia   2. Acute bilateral low back pain without sciatica   3. Muscle spasm of back   4. Dizziness and giddiness        Problem List Patient Active Problem List   Diagnosis Date Noted  . Whiplash injury 11/26/2018  . Mild concussion 11/11/2018  . Elevated LFTs 11/10/2018  . Thrombocytopenia (HCC) 08/16/2017  . Vitamin D deficiency 08/16/2017   Mayer CamelJennifer Carlson-Long, PTA 12/18/18 10:48 AM  [facility] [address] [place], [place], [address] Phone: [phone]   Fax:  [phone]  Name: Julia Schultz MRN: [id number] Date of Birth: 03-Sep-1980

## 2018-12-23 ENCOUNTER — Ambulatory Visit: Payer: 59 | Admitting: Physical Therapy

## 2018-12-23 ENCOUNTER — Other Ambulatory Visit: Payer: Self-pay

## 2018-12-23 ENCOUNTER — Encounter: Payer: Self-pay | Admitting: Physical Therapy

## 2018-12-23 DIAGNOSIS — M542 Cervicalgia: Secondary | ICD-10-CM

## 2018-12-23 DIAGNOSIS — R42 Dizziness and giddiness: Secondary | ICD-10-CM | POA: Diagnosis not present

## 2018-12-23 DIAGNOSIS — M545 Low back pain, unspecified: Secondary | ICD-10-CM

## 2018-12-23 DIAGNOSIS — M62830 Muscle spasm of back: Secondary | ICD-10-CM

## 2018-12-23 NOTE — Therapy (Signed)
[facility] [address] [place], [place], [address] Phone: [phone]   Fax:  [phone]  Physical Therapy Treatment  Patient Details  Name: Julia Mulcheana Schultz MRN: [id number] Date of Birth: 04-03-1981 Referring Provider (PT): Terrilee FilesZach Smith   Encounter Date: 12/23/2018  PT End of Session - 12/23/18 1124    Visit Number  6    Number of Visits  12    Date for PT Re-Evaluation  01/14/19    Authorization Type  [employer]    PT Start Time  1040    PT Stop Time  1120    PT Time Calculation (min)  40 min    Activity Tolerance  Patient tolerated treatment well    Behavior During Therapy  [facility] for tasks assessed/performed       Past Medical History:  Diagnosis Date  . 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 Anemia   . Chronic cholecystitis with calculus   . Complication of anesthesia    woke as a child during procedure  . GERD (gastroesophageal reflux disease)   . Headache    migraines  . Menorrhagia   . Thrombocytopenia (HCC) 2005    Past Surgical History:  Procedure Laterality Date  . BREAST SURGERY  2003   breast reduction   . CESAREAN SECTION     x 3  . CHOLECYSTECTOMY N/A 08/10/2015   Procedure: LAPAROSCOPIC CHOLECYSTECTOMY WITH INTRAOPERATIVE CHOLANGIOGRAM;  Surgeon: Manus RuddMatthew Tsuei, MD;  Location: [facility] OR;  Service: General;  Laterality: N/A;  . COLPOSCOPY  2018  . FOOT SURGERY Left child 4th grade   correct arch  . TUBAL LIGATION Bilateral 2010    There were no vitals filed for this visit.  Subjective Assessment - 12/23/18 1039    Subjective  pt arrived late to session today.  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  had some dizziness last night after driving - didn't last long    Limitations  Sitting;Lifting;Standing;Walking;House hold activities    Patient Stated Goals  Decreased pain in neck and back    Currently in  Pain?  Yes    Pain Score  5    "freaking irritating"   Pain Location  Back    Pain Orientation  Lower    Pain Type  Acute pain    Pain Onset  1 to 4 weeks ago    Pain Frequency  Constant    Aggravating Factors   work duties, chores    Pain Relieving Factors  epsom salt bath, medication                       [facility] Adult PT Treatment/Exercise - 12/23/18 1122      Self-Care   Self-Care  Other Self-Care Comments    Other Self-Care Comments   long discussion with pt on symptoms, and her increased anxiety and faituge levels.  Feel some of pt's symptoms are coming from her anxiety.  Advised her to follow up with Colen DarlingLisa Flores, for counseling as well as with Rinaldo CloudSam Worley for medication management.  Pt verbalized understanding.      Vestibular Treatment/Exercise - 12/23/18 1122      Vestibular Treatment/Exercise   Vestibular Treatment Provided  Gaze    Gaze Exercises  X1 Viewing Horizontal;X1 Viewing Vertical      X1 Viewing Horizontal  Foot Position  walking outdoors 20'    Reps  1    Comments  increase in sway, staggering ~ 12" outside BOS      X1 Viewing Vertical   Foot Position  walking outdoors 20'    Reps  1    Comments  increase in sway, staggering ~ 12" outside BOS         Balance Exercises - 12/23/18 1106      Balance Exercises: Standing   Standing Eyes Opened  Wide (BOA);Head turns;Foam/compliant surface;2 reps;20 secs    Standing Eyes Closed  Foam/compliant surface;Narrow base of support (BOS);5 reps;10 secs          PT Short Term Goals - 12/03/18 1010      PT SHORT TERM GOAL #1   Title  Pt to be independent with initial HEP    Time  6    Period  Weeks    Status  New    Target Date  01/14/19      PT SHORT TERM GOAL #2   Title  Pt to report decreased pain in neck to 5/10 with activity        PT Long Term Goals - 12/15/18 1407      PT LONG TERM GOAL #1   Title  Pt to be independent with long term HEP    Time  6    Period  Weeks     Status  New    Target Date  01/14/19      PT LONG TERM GOAL #2   Title  Pt to report decreased pain to 0-2/10 in neck and back, to improve ability for work duties.    Time  6    Period  Weeks    Status  New    Target Date  01/14/19      PT LONG TERM GOAL #3   Title  Pt to demo soft tissue restrictions in cervical region to be WNL, to improve pain and mobility.    Time  6    Period  Weeks    Status  New    Target Date  01/14/19      PT LONG TERM GOAL #4   Title  Pt to demo cervical and lumbar ROM to be [facility] to improve ability for driving and IADLS.    Time  6    Period  Weeks    Status  New    Target Date  01/14/19      PT LONG TERM GOAL #5   Title  report 75% improvement in vestibular symptoms for improved function    Status  New    Target Date  01/14/19            Plan - 12/23/18 1125    Clinical Impression Statement  Pt with minimal compliance with HEP at this time and therefore encouraged compliance.  Pt also still with anxiety and fatigue following concussion and feel some of her dizziness is linked to these.  Recommended follow up for anxiety to help in conjunction with PT.    Examination-Activity Limitations  Sleep;Bend;Squat;Caring for Others;Carry;Stand;Lift;Reach Overhead    Examination-Participation Restrictions  Cleaning;Community Activity;Driving    Rehab Potential  Good    PT Frequency  2x / week    PT Duration  6 weeks    PT Treatment/Interventions  ADLs/Self Care Home Management;Cryotherapy;Electrical Stimulation;Iontophoresis 4mg /ml Dexamethasone;Moist Heat;Therapeutic activities;Ultrasound;Traction;Therapeutic exercise;Neuromuscular re-education;Patient/family education;Taping;Dry needling;Passive range of motion;Manual techniques;Vestibular;Visual/perceptual remediation/compensation;Joint Manipulations;Spinal Manipulations;Gait training;Stair training;Functional mobility training;Canalith  Repostioning    PT Next Visit Plan  continue postural strengthening/  stretching; back care education, continue balance/vestibular training PRN    PT Home Exercise Plan  code: [email or web address]; Vestibular Access Code: [email or web address]    Consulted and Agree with Plan of Care  Patient       Patient will benefit from skilled therapeutic intervention in order to improve the following deficits and impairments:  Decreased activity tolerance, Decreased strength, Pain, Decreased mobility, Increased muscle spasms, Improper body mechanics, Decreased range of motion, Impaired flexibility, Dizziness  Visit Diagnosis: 1. Cervicalgia   2. Acute bilateral low back pain without sciatica   3. Muscle spasm of back   4. Dizziness and giddiness        Problem List Patient Active Problem List   Diagnosis Date Noted  . Whiplash injury 11/26/2018  . Mild concussion 11/11/2018  . Elevated LFTs 11/10/2018  . Thrombocytopenia ([place]) 08/16/2017  . Vitamin D deficiency 08/16/2017      Laureen Abrahams, PT, DPT 12/23/18 11:27 AM     [facility] [place], [place], [address] Phone: [phone]   Fax:  [phone]  Name: Julia Schultz MRN: [id number] Date of Birth: 05-Sep-1980

## 2018-12-25 ENCOUNTER — Ambulatory Visit: Payer: 59 | Admitting: Psychology

## 2018-12-26 ENCOUNTER — Ambulatory Visit: Payer: 59 | Admitting: Physical Therapy

## 2018-12-26 ENCOUNTER — Other Ambulatory Visit: Payer: Self-pay

## 2018-12-26 ENCOUNTER — Encounter: Payer: Self-pay | Admitting: Physical Therapy

## 2018-12-26 DIAGNOSIS — M62830 Muscle spasm of back: Secondary | ICD-10-CM

## 2018-12-26 DIAGNOSIS — M545 Low back pain, unspecified: Secondary | ICD-10-CM

## 2018-12-26 DIAGNOSIS — M542 Cervicalgia: Secondary | ICD-10-CM | POA: Diagnosis not present
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
## 2018-12-29 ENCOUNTER — Encounter: Payer: 59 | Admitting: Physical Therapy

## 2018-12-29 ENCOUNTER — Telehealth: Payer: Self-pay | Admitting: Physical Therapy

## 2018-12-29 NOTE — Telephone Encounter (Signed)
Patient did not show for PT appointment.  Called patient regarding missed appt, and she reports she didn't realize she had an appt because she "didn't get a reminder call".  She apologized and was agreeable to rescheduling.   Confirmed new appts with her.    Kerin Perna, PTA 12/29/18 12:23 PM

## 2018-12-31 ENCOUNTER — Encounter: Payer: Self-pay | Admitting: Family Medicine

## 2018-12-31 ENCOUNTER — Ambulatory Visit (INDEPENDENT_AMBULATORY_CARE_PROVIDER_SITE_OTHER): Payer: 59 | Admitting: Family Medicine

## 2018-12-31 DIAGNOSIS — F419 Anxiety disorder, unspecified: Secondary | ICD-10-CM | POA: Diagnosis not present

## 2018-12-31 DIAGNOSIS — S060X0D Concussion without loss of consciousness, subsequent encounter: Secondary | ICD-10-CM | POA: Diagnosis not present

## 2018-12-31 MED ORDER — VENLAFAXINE HCL ER 37.5 MG PO CP24: 37.5000 mg | ORAL_CAPSULE | Freq: Every day | ORAL | 1 refills | Status: DC
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
## 2018-12-31 NOTE — Assessment & Plan Note (Addendum)
Near resolved   still emotional  Start  effexor warned side effects  rtc virtual 2  weeks

## 2019-01-05 ENCOUNTER — Other Ambulatory Visit: Payer: Self-pay

## 2019-01-05 ENCOUNTER — Ambulatory Visit: Payer: 59 | Admitting: Physical Therapy

## 2019-01-05 DIAGNOSIS — M545 Low back pain, unspecified: Secondary | ICD-10-CM

## 2019-01-05 DIAGNOSIS — M62830 Muscle spasm of back: Secondary | ICD-10-CM | POA: Diagnosis not present

## 2019-01-05 DIAGNOSIS — M542 Cervicalgia: Secondary | ICD-10-CM | POA: Diagnosis not present
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
## 2019-01-07 ENCOUNTER — Ambulatory Visit (INDEPENDENT_AMBULATORY_CARE_PROVIDER_SITE_OTHER): Payer: 59 | Admitting: Physical Therapy

## 2019-01-07 ENCOUNTER — Encounter: Payer: Self-pay | Admitting: Physical Therapy

## 2019-01-07 ENCOUNTER — Other Ambulatory Visit: Payer: Self-pay

## 2019-01-07 DIAGNOSIS — M62830 Muscle spasm of back: Secondary | ICD-10-CM

## 2019-01-07 DIAGNOSIS — R42 Dizziness and giddiness: Secondary | ICD-10-CM

## 2019-01-07 DIAGNOSIS — M545 Low back pain, unspecified: Secondary | ICD-10-CM

## 2019-01-07 DIAGNOSIS — M542 Cervicalgia: Secondary | ICD-10-CM | POA: Diagnosis not present

## 2019-01-07 NOTE — Patient Instructions (Signed)
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
## 2019-01-09 ENCOUNTER — Ambulatory Visit (INDEPENDENT_AMBULATORY_CARE_PROVIDER_SITE_OTHER): Payer: 59 | Admitting: Family Medicine

## 2019-01-09 ENCOUNTER — Ambulatory Visit (INDEPENDENT_AMBULATORY_CARE_PROVIDER_SITE_OTHER): Payer: 59 | Admitting: Psychology

## 2019-01-09 ENCOUNTER — Encounter: Payer: Self-pay | Admitting: Family Medicine

## 2019-01-09 DIAGNOSIS — S060X0D Concussion without loss of consciousness, subsequent encounter: Secondary | ICD-10-CM | POA: Diagnosis not present

## 2019-01-09 DIAGNOSIS — F320 Major depressive disorder, single episode, mild: Secondary | ICD-10-CM | POA: Diagnosis not present

## 2019-01-09 DIAGNOSIS — S134XXA Sprain of ligaments of cervical spine, initial encounter: Secondary | ICD-10-CM

## 2019-01-09 DIAGNOSIS — F419 Anxiety disorder, unspecified: Secondary | ICD-10-CM
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
## 2019-01-14 ENCOUNTER — Ambulatory Visit: Payer: 59 | Admitting: Family Medicine

## 2019-01-14 ENCOUNTER — Encounter: Payer: 59 | Admitting: Physical Therapy

## 2019-01-15 ENCOUNTER — Ambulatory Visit: Payer: Self-pay | Admitting: Psychology

## 2019-01-15 ENCOUNTER — Ambulatory Visit: Payer: 59 | Admitting: Psychology

## 2019-01-16 ENCOUNTER — Encounter: Payer: 59 | Admitting: Physical Therapy

## 2019-01-19 ENCOUNTER — Ambulatory Visit (INDEPENDENT_AMBULATORY_CARE_PROVIDER_SITE_OTHER): Payer: 59 | Admitting: Psychology

## 2019-01-19 ENCOUNTER — Other Ambulatory Visit: Payer: Self-pay

## 2019-01-19 ENCOUNTER — Encounter: Payer: Self-pay | Admitting: Physical Therapy

## 2019-01-19 ENCOUNTER — Ambulatory Visit: Payer: 59 | Admitting: Physical Therapy

## 2019-01-19 DIAGNOSIS — M62830 Muscle spasm of back: Secondary | ICD-10-CM | POA: Diagnosis not present

## 2019-01-19 DIAGNOSIS — F320 Major depressive disorder, single episode, mild: Secondary | ICD-10-CM

## 2019-01-19 DIAGNOSIS — M542 Cervicalgia: Secondary | ICD-10-CM
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
## 2019-01-19 NOTE — Patient Instructions (Signed)
Over Head Pull: Narrow Grip     Perform 2 sets of 10 of each of the exercises below.   On back, knees bent, feet flat, band across thighs, elbows straight but relaxed. Pull hands apart (start). Keeping elbows straight, bring arms up and over head, hands toward floor. Keep pull steady on band. Hold momentarily. Return slowly, keeping pull steady, back to start. Repeat _10_ times. Band color ___red___   Side Pull: Double Arm   On back, knees bent, feet flat. Arms perpendicular to body, shoulder level, elbows straight but relaxed. Pull arms out to sides, elbows straight. Resistance band comes across collarbones, hands toward floor. Hold momentarily. Slowly return to starting position. Repeat __10_ times. Band color ___red__   Elmer Picker   On back, knees bent, feet flat, left hand on left hip, right hand above left. Pull right arm DIAGONALLY (hip to shoulder) across chest. Bring right arm along head toward floor. Hold momentarily. Slowly return to starting position. Repeat _10__ times. Do with left arm. Band color __red___   Shoulder Rotation: Double Arm   On back, knees bent, feet flat, elbows tucked at sides, bent 90, hands palms up. Pull hands apart and down toward floor, keeping elbows near sides. Hold momentarily. Slowly return to starting position. Repeat _10__ times. Band color ___red___

## 2019-01-20 NOTE — Addendum Note (Signed)
Addended by: Laureen Abrahams on: 01/20/2019 02:28 PM   Modules accepted: Orders

## 2019-01-26 ENCOUNTER — Encounter: Payer: Self-pay | Admitting: Rehabilitative and Restorative Service Providers"

## 2019-01-26 ENCOUNTER — Other Ambulatory Visit: Payer: Self-pay

## 2019-01-26 ENCOUNTER — Ambulatory Visit (INDEPENDENT_AMBULATORY_CARE_PROVIDER_SITE_OTHER): Payer: 59 | Admitting: Rehabilitative and Restorative Service Providers"

## 2019-01-26 DIAGNOSIS — M545 Low back pain, unspecified: Secondary | ICD-10-CM

## 2019-01-26 DIAGNOSIS — M542 Cervicalgia: Secondary | ICD-10-CM

## 2019-01-26 DIAGNOSIS — M62830 Muscle spasm of back: Secondary | ICD-10-CM
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
## 2019-01-26 NOTE — Progress Notes (Signed)
Tawana ScaleZach Kailo Kosik D.O. [facility] [address] [place], [place] [address] Phone: [phone] Subjective:   Bruce Donath, Valerie Wolf, am serving as a scribe for Dr. Antoine PrimasZachary Aaban Griep.    CC: Whiplash and neck pain follow-up  BJY:[id number]:Subjective  Julia Schultz is a 38 y.o. female coming in with complaint of neck and back pain. Is going to physical therapy. Had dry needling done yesterday. Is sore today. Has had relief previously.  Patient feels that her concussion is completely healed at this time.  Patient has never without any neck pain.  Patient still has discomfort.  Patient denies any radiation down the arms at the moment.     Past Medical History:  Diagnosis Date  . 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 Anemia   . Chronic cholecystitis with calculus   . Complication of anesthesia    woke as a child during procedure  . GERD (gastroesophageal reflux disease)   . Headache    migraines  . Menorrhagia   . Thrombocytopenia ([facility]) 2005   Past Surgical History:  Procedure Laterality Date  . BREAST SURGERY  2003   breast reduction   . CESAREAN SECTION     x 3  . CHOLECYSTECTOMY N/A 08/10/2015   Procedure: LAPAROSCOPIC CHOLECYSTECTOMY WITH INTRAOPERATIVE CHOLANGIOGRAM;  Surgeon: Manus RuddMatthew Tsuei, MD;  Location: [facility];  Service: General;  Laterality: N/A;  . COLPOSCOPY  2018  . FOOT SURGERY Left child 4th grade   correct arch  . TUBAL LIGATION Bilateral 2010   Social History   Socioeconomic History  . Marital status: Legally Separated    Spouse name: Not on file  . Number of children: Not on file  . Years of education: Not on file  . Highest education level: Not on file  Occupational History  . Not on file  Social Needs  . Financial resource strain: Not on file  . Food insecurity    Worry: Not on file    Inability: Not on file  . Transportation needs    Medical: Not on file    Non-medical: Not on file  Tobacco Use  . Smoking status: Never Smoker  .  Smokeless tobacco: Never Used  Substance and Sexual Activity  . Alcohol use: Yes    Comment: occ  . Drug use: No  . Sexual activity: Yes    Partners: Male    Birth control/protection: Surgical    Comment: BTL  Lifestyle  . Physical activity    Days per week: Not on file    Minutes per session: Not on file  . Stress: Not on file  Relationships  . Social Musicianconnections    Talks on phone: Not on file    Gets together: Not on file    Attends religious service: Not on file    Active member of club or organization: Not on file    Attends meetings of clubs or organizations: Not on file    Relationship status: Not on file  Other Topics Concern  . Not on file  Social History Narrative  . Not on file   Allergies  Allergen Reactions  . Banana Anaphylaxis    "lump in throat"  . Latex Itching  . Other     Walnuts cause itchy throat  . Tomato Hives and Swelling   Family History  Problem Relation Age of Onset  . Hyperlipidemia Mother   . Hypertension Mother   . Diabetes Mother   .  Diverticulitis Mother   . Pancreatic cancer Father     Current Outpatient Medications (Endocrine & Metabolic):  .  medroxyPROGESTERone (PROVERA) 5 MG tablet, Take 1 tablet po daily x 5 days every other month if no spontaneous menses.    Current Outpatient Medications (Analgesics):  .  meloxicam (MOBIC) 15 MG tablet, Take 1 tablet (15 mg total) by mouth daily.   Current Outpatient Medications (Other):  .  cholecalciferol (VITAMIN D) 1000 units tablet, Take 1,000 Units by mouth daily. .  cyclobenzaprine (FLEXERIL) 10 MG tablet, Take 1 tablet (10 mg total) by mouth 3 (three) times daily as needed for muscle spasms. Marland Kitchen  gabapentin (NEURONTIN) 100 MG capsule, Take 2 capsules (200 mg total) by mouth at bedtime. .  hydrOXYzine (ATARAX/VISTARIL) 25 MG tablet, Take 1 tablet (25 mg total) by mouth every 8 (eight) hours as needed for anxiety. .  Multiple Vitamins-Minerals (MULTIVITAMIN PO), Take 1 tablet by  mouth daily.  Marland Kitchen  venlafaxine XR (EFFEXOR XR) 37.5 MG 24 hr capsule, Take 1 capsule (37.5 mg total) by mouth daily with breakfast. .  Vitamin D, Ergocalciferol, (DRISDOL) 1.25 MG (50000 UT) CAPS capsule, Take 1 capsule (50,000 Units total) by mouth every 7 (seven) days.    Past medical history, social, surgical and family history all reviewed in electronic medical record.  No pertanent information unless stated regarding to the chief complaint.   Review of Systems:  No headache, visual changes, nausea, vomiting, diarrhea, constipation, dizziness, abdominal pain, skin rash, fevers, chills, night sweats, weight loss, swollen lymph nodes, body aches, joint swelling,  chest pain, shortness of breath, mood changes.  Positive muscle aches  Objective  Blood pressure 118/78, pulse 81, height 5\' 2"  (1.575 m), weight 248 lb (112.5 kg), SpO2 98 %.    General: No apparent distress alert and oriented x3 mood and affect normal, dressed appropriately.  HEENT: Pupils equal, extraocular movements intact  Respiratory: Patient's speak in full sentences and does not appear short of breath  Cardiovascular: No lower extremity edema, non tender, no erythema  Skin: Warm dry intact with no signs of infection or rash on extremities or on axial skeleton.  Abdomen: Soft nontender  Neuro: Cranial nerves II through XII are intact, neurovascularly intact in all extremities with 2+ DTRs and 2+ pulses.  Lymph: No lymphadenopathy of posterior or anterior cervical chain or axillae bilaterally.  Gait normal with good balance and coordination.  MSK:  Non tender with full range of motion and good stability and symmetric strength and tone of shoulders, elbows, wrist, hip, knee and ankles bilaterally.  Neck: Inspection loss of lordosis. No palpable stepoffs. Negative Spurling's maneuver. Full neck range of motion Grip strength and sensation normal in bilateral hands Strength good C4 to T1 distribution No sensory change to C4  to T1 Negative Hoffman sign bilaterally Reflexes normal Tightness of the trapezius bilaterally  Osteopathic findings  C6 flexed rotated and side bent left T3 extended rotated and side bent right inhaled third rib T7 extended rotated and side bent left L2 flexed rotated and side bent right Sacrum Left    Impression and Recommendations:     This case required medical decision making of moderate complexity. The above documentation has been reviewed and is accurate and complete Lyndal Pulley, DO       Note: This dictation was prepared with Dragon dictation along with smaller phrase technology. Any transcriptional errors that result from this process are unintentional.

## 2019-01-27 ENCOUNTER — Encounter: Payer: Self-pay | Admitting: Family Medicine

## 2019-01-27 ENCOUNTER — Ambulatory Visit: Payer: 59 | Admitting: Family Medicine

## 2019-01-27 DIAGNOSIS — S134XXA Sprain of ligaments of cervical spine, initial encounter: Secondary | ICD-10-CM

## 2019-01-27 DIAGNOSIS — M999 Biomechanical lesion, unspecified: Secondary | ICD-10-CM | POA: Diagnosis not present

## 2019-01-27 NOTE — Assessment & Plan Note (Signed)
Continue to be asymptomatic. Feeling well to osteopathic manipulation, encourage patient continue with the home exercises ongoing physical therapy as well patient continues to make improvement.  Patient is to increase activity slowly over the course of next 3 days and follow-up with 4 to 8 weeks

## 2019-01-27 NOTE — Assessment & Plan Note (Signed)
Decision today to treat with OMT was based on Physical Exam  After verbal consent patient was treated with HVLA, ME, FPR techniques in cervical, thoracic, rib lumbar and sacral areas  Patient tolerated the procedure well with improvement in symptoms  Patient given exercises, stretches and lifestyle modifications  See medications in patient instructions if given  Patient will follow up in 4-8 weeks 

## 2019-01-27 NOTE — Patient Instructions (Signed)
Tried OMT today Continue with physical therapy Continue vitamins  See me again in 4 weeks

## 2019-01-28 ENCOUNTER — Encounter: Payer: Self-pay | Admitting: Rehabilitative and Restorative Service Providers"

## 2019-01-28 ENCOUNTER — Other Ambulatory Visit: Payer: Self-pay

## 2019-01-28 ENCOUNTER — Ambulatory Visit: Payer: 59 | Admitting: Rehabilitative and Restorative Service Providers"

## 2019-01-28 DIAGNOSIS — M542 Cervicalgia: Secondary | ICD-10-CM

## 2019-01-28 DIAGNOSIS — M62830 Muscle spasm of back: Secondary | ICD-10-CM

## 2019-01-28 DIAGNOSIS — M545 Low back pain, unspecified: Secondary | ICD-10-CM

## 2019-01-28 DIAGNOSIS — R42 Dizziness and giddiness: Secondary | ICD-10-CM

## 2019-01-28 NOTE — Therapy (Signed)
[facility] [address] [place], [place], [address] Phone: [phone]   Fax:  [phone]  Physical Therapy Treatment  Patient Details  Name: Julia Schultz MRN: [id number] Date of Birth: 04/04/1981 Referring Provider (PT): Terrilee FilesZach Smith   Encounter Date: 01/28/2019  PT End of Session - 01/28/19 1528    Visit Number  12    Number of Visits  24    Date for PT Re-Evaluation  03/11/19    PT Start Time  1526    PT Stop Time  1606    PT Time Calculation (min)  40 min    Activity Tolerance  Patient tolerated treatment well       Past Medical History:  Diagnosis Date  . 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 Anemia   . Chronic cholecystitis with calculus   . Complication of anesthesia    woke as a child during procedure  . GERD (gastroesophageal reflux disease)   . Headache    migraines  . Menorrhagia   . Thrombocytopenia ([facility]) 2005    Past Surgical History:  Procedure Laterality Date  . BREAST SURGERY  2003   breast reduction   . CESAREAN SECTION     x 3  . CHOLECYSTECTOMY N/A 08/10/2015   Procedure: LAPAROSCOPIC CHOLECYSTECTOMY WITH INTRAOPERATIVE CHOLANGIOGRAM;  Surgeon: Manus RuddMatthew Tsuei, MD;  Location: [facility];  Service: General;  Laterality: N/A;  . COLPOSCOPY  2018  . FOOT SURGERY Left child 4th grade   correct arch  . TUBAL LIGATION Bilateral 2010    There were no vitals filed for this visit.  Subjective Assessment - 01/28/19 1530    Subjective  Still sore from DN - really sore. Saw Dr Katrinka BlazingSmith yesterday and he "cracked" her back. She is still having LBP and neck pain. Will see MD a few more visits for adjustments and will continue PT. Feels the needling does help.    Currently in Pain?  Yes    Pain Score  2     Pain Location  Neck    Pain Orientation  Right;Left    Pain Descriptors / Indicators  Aching;Sore    Pain Type  Acute pain    Pain Score  3    Pain Location  Back    Pain  Orientation  Right;Left    Pain Descriptors / Indicators  Aching;Tightness                       [facility] Adult PT Treatment/Exercise - 01/28/19 0001      Neck Exercises: Theraband   Shoulder Extension  15 reps;Red    Rows  15 reps;Red    Rows Limitations  bow and arrow red TB x 10 each side       Neck Exercises: Seated   Other Seated Exercise  reaching up w/ one UE over to opposite side; cross arm stretch; upper spine rotation reaching to arm of chair; curling forward one vetebrae at a time reaching forward toward floor 3 reps each       Lumbar Exercises: Aerobic   Nustep  L5: 5 min (arms and legs)       Lumbar Exercises: Supine   Ab Set  10 reps    Clam  10 reps   hold one knee still moving opposite - red TB core tight   Bent Knee Raise  10 reps      Moist Heat  Therapy   Number Minutes Moist Heat  15 Minutes    Moist Heat Location  Cervical;Lumbar Spine   thoracic      Electrical Stimulation   Electrical Stimulation Location  bilat upper trap/ bilat cervical paraspinals    Electrical Stimulation Action  IFC     Electrical Stimulation Parameters  to tolerance    Electrical Stimulation Goals  Pain;Tone               PT Short Term Goals - 01/19/19 1712      PT SHORT TERM GOAL #1   Title  Pt to be independent with initial HEP    Time  6    Period  Weeks    Status  Achieved    Target Date  01/14/19      PT SHORT TERM GOAL #2   Title  Pt to report decreased pain in neck to 5/10 with activity    Status  Achieved        PT Long Term Goals - 01/28/19 1534      PT LONG TERM GOAL #1   Title  Pt to be independent with long term HEP    Time  6    Period  Weeks    Status  On-going    Target Date  03/11/19      PT LONG TERM GOAL #2   Title  Pt to report decreased pain to 0-2/10 in neck and back, to improve ability for work duties.    Time  6    Period  Weeks    Status  On-going    Target Date  03/11/19      PT LONG TERM GOAL #3   Title  Pt  to demo soft tissue restrictions in cervical region to be WNL, to improve pain and mobility.    Time  6    Period  Weeks    Status  On-going    Target Date  03/11/19      PT LONG TERM GOAL #4   Title  Pt to demo cervical and lumbar ROM to be [facility] to improve ability for driving and IADLS.    Time  6    Period  Weeks    Status  Achieved      PT LONG TERM GOAL #5   Title  report 75% improvement in vestibular symptoms for improved function    Status  Achieved            Plan - 01/28/19 1530    Clinical Impression Statement  Patient reports continued pain in the neck and back. She has continued muscular tightness and limited activity level. she has responded well to DN and manual work and is progressing gradually with strengthening and stabilization.Patient will benefit from continued PT to address ongoing problems.    Examination-Activity Limitations  Sleep;Bend;Squat;Caring for Others;Carry;Stand;Lift;Reach Overhead    Examination-Participation Restrictions  Cleaning;Community Activity;Driving    Stability/Clinical Decision Making  Evolving/Moderate complexity    Rehab Potential  Good    PT Frequency  2x / week    PT Duration  4 weeks    PT Treatment/Interventions  ADLs/Self Care Home Management;Cryotherapy;Electrical Stimulation;Iontophoresis 4mg /ml Dexamethasone;Moist Heat;Therapeutic activities;Ultrasound;Traction;Therapeutic exercise;Neuromuscular re-education;Patient/family education;Taping;Dry needling;Passive range of motion;Manual techniques;Vestibular;Visual/perceptual remediation/compensation;Joint Manipulations;Spinal Manipulations;Gait training;Stair training;Functional mobility training;Canalith Repostioning    PT Next Visit Plan  cont PT 2x/wk x 4-6 more weeks for HEP and neck pain/tightness; continue DN/manual work as indicated; progress with core stabilization and strengthening as pt tolerates  PT Home Exercise Plan  code: [email or web address]; Vestibular Access Code: [email or web address]     Consulted and Agree with Plan of Care  Patient       Patient will benefit from skilled therapeutic intervention in order to improve the following deficits and impairments:  Decreased activity tolerance, Decreased strength, Pain, Decreased mobility, Increased muscle spasms, Improper body mechanics, Decreased range of motion, Impaired flexibility, Dizziness  Visit Diagnosis: 1. Cervicalgia   2. Muscle spasm of back   3. Acute bilateral low back pain without sciatica   4. Dizziness and giddiness        Problem List Patient Active Problem List   Diagnosis Date Noted  . Nonallopathic lesion of cervical region 01/27/2019  . Nonallopathic lesion of thoracic region 01/27/2019  . Nonallopathic lesion of lumbosacral region 01/27/2019  . Nonallopathic lesion of rib cage 01/27/2019  . Nonallopathic lesion of sacral region 01/27/2019  . Anxiety 12/31/2018  . Whiplash injury 11/26/2018  . Mild concussion 11/11/2018  . Elevated LFTs 11/10/2018  . Thrombocytopenia ([facility]) 08/16/2017  . Vitamin D deficiency 08/16/2017    Bemnet Trovato Rober MinionP Pleasant Bensinger PT, MPH  01/28/2019, 4:03 PM  [facility] [address] [place], [place], [address] Phone: [phone]   Fax:  [phone]  Name: Julia Schultz MRN: [id number] Date of Birth: 11-06-1980

## 2019-02-04 ENCOUNTER — Other Ambulatory Visit: Payer: Self-pay

## 2019-02-04 ENCOUNTER — Ambulatory Visit: Payer: 59 | Admitting: Rehabilitative and Restorative Service Providers"

## 2019-02-04 ENCOUNTER — Encounter: Payer: Self-pay | Admitting: Rehabilitative and Restorative Service Providers"

## 2019-02-04 DIAGNOSIS — R42 Dizziness and giddiness: Secondary | ICD-10-CM

## 2019-02-04 DIAGNOSIS — M542 Cervicalgia: Secondary | ICD-10-CM

## 2019-02-04 DIAGNOSIS — M545 Low back pain, unspecified: Secondary | ICD-10-CM

## 2019-02-04 DIAGNOSIS — M62830 Muscle spasm of back: Secondary | ICD-10-CM

## 2019-02-04 NOTE — Patient Instructions (Signed)
Access Code: [email or web address]  URL: [email or web address]  Date: 02/04/2019  Prepared by: Gillermo Murdoch   Exercises  Seated Cervical Retraction - 10 reps - 1 sets - 3x daily - 7x weekly  Standing Scapular Retraction - 10 reps - 1 sets - 10 hold - 3x daily - 7x weekly  Shoulder External Rotation and Scapular Retraction - 10 reps - 1 sets - hold - 3x daily - 7x weekly  Standing Scapular Retraction in Abduction - 10 reps - 1 sets - 3x daily - 7x weekly  Doorway Pec Stretch at 60 Degrees Abduction - 3 reps - 1 sets - 3x daily - 7x weekly  Doorway Pec Stretch at 90 Degrees Abduction - 3 reps - 1 sets - 30 seconds hold - 3x daily - 7x weekly  Doorway Pec Stretch at 120 Degrees Abduction - 3 reps - 1 sets - 30 second hold hold - 3x daily - 7x weekly  Shoulder External Rotation and Scapular Retraction with Resistance - 10 reps - 3 sets - 2x daily - 7x weekly  Scapular Retraction with Resistance - 10 reps - 3 sets - 2x daily - 7x weekly  Scapular Retraction with Resistance Advanced - 10 reps - 3 sets - 2x daily - 7x weekly  Cat to Child's Pose with Posterior Pelvic Tilt - 10 reps - 1 sets - 20 sec hold - 1x daily - 7x weekly  Seated Flexion Stretch - 10 reps - 1 sets - 20 sec hold - 1x daily - 7x weekly  Seated Sidebending - 3 reps - 1 sets - 30 sec hold - 2x daily - 7x weekly  Standing Backward Shoulder Rolls - 10 reps - 1 sets - 2x daily - 7x weekly  Seated Cradle Shoulder Flexion and Horizontal Abduction PROM - 3 reps - 1 sets - 30 sec hold - 2x daily - 7x weekly
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
## 2019-02-05 ENCOUNTER — Ambulatory Visit (INDEPENDENT_AMBULATORY_CARE_PROVIDER_SITE_OTHER): Payer: 59 | Admitting: Psychology

## 2019-02-05 DIAGNOSIS — F320 Major depressive disorder, single episode, mild: Secondary | ICD-10-CM

## 2019-02-06 ENCOUNTER — Ambulatory Visit (INDEPENDENT_AMBULATORY_CARE_PROVIDER_SITE_OTHER): Payer: 59 | Admitting: Rehabilitative and Restorative Service Providers"

## 2019-02-06 ENCOUNTER — Other Ambulatory Visit: Payer: Self-pay

## 2019-02-06 ENCOUNTER — Encounter: Payer: Self-pay | Admitting: Rehabilitative and Restorative Service Providers"

## 2019-02-06 DIAGNOSIS — M545 Low back pain, unspecified: Secondary | ICD-10-CM

## 2019-02-06 DIAGNOSIS — M542 Cervicalgia: Secondary | ICD-10-CM | POA: Diagnosis not present

## 2019-02-06 DIAGNOSIS — M62830 Muscle spasm of back: Secondary | ICD-10-CM

## 2019-02-06 DIAGNOSIS — R42 Dizziness and giddiness: Secondary | ICD-10-CM | POA: Diagnosis not present
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
## 2019-02-10 ENCOUNTER — Ambulatory Visit: Payer: 59 | Admitting: Certified Nurse Midwife

## 2019-02-10 ENCOUNTER — Encounter: Payer: Self-pay | Admitting: Certified Nurse Midwife

## 2019-02-10 ENCOUNTER — Other Ambulatory Visit: Payer: Self-pay

## 2019-02-10 VITALS — BP 120/82 | HR 70 | Temp 97.2°F | Resp 16 | Wt 250.0 lb

## 2019-02-10 DIAGNOSIS — N912 Amenorrhea, unspecified: Secondary | ICD-10-CM | POA: Diagnosis not present

## 2019-02-10 DIAGNOSIS — Z113 Encounter for screening for infections with a predominantly sexual mode of transmission: Secondary | ICD-10-CM | POA: Diagnosis not present

## 2019-02-10 NOTE — Patient Instructions (Signed)
Preventing Sexually Transmitted Infections, Adult Sexually transmitted infections (STIs) are diseases that are passed (transmitted) from person to person through bodily fluids exchanged during sex or sexual contact. Bodily fluids include saliva, semen, blood, vaginal mucus, and urine. You may have an increased risk for developing an STI if you have unprotected oral, vaginal, or anal sex. Some common STIs include:  Herpes.  Hepatitis B.  Chlamydia.  Gonorrhea.  Syphilis.  HPV (human papillomavirus).  HIV (human immunodeficiency virus), the virus that can cause AIDS (acquired immunodeficiency syndrome). How can I protect myself from sexually transmitted infections? The only way to completely prevent STIs is not to have sex of any kind (practice abstinence). This includes oral, vaginal, or anal sex. If you are sexually active, take these actions to lower your risk of getting an STI:  Have only one sex partner (be monogamous) or limit the number of sexual partners you have.  Stay up-to-date on immunizations. Certain vaccines can lower your risk of getting certain STIs, such as: ? Hepatitis A and B vaccines. You may have been vaccinated as a young child, but likely need a booster shot as a teen or young adult. ? HPV vaccine.  Use methods that prevent the exchange of body fluids between partners (barrier protection) every time you have sex. Barrier protection can be used during oral, vaginal, or anal sex. Commonly used barrier methods include: ? Female condom. ? Female condom. ? Dental dam.  Get tested regularly for STIs. Have your sexual partner get tested regularly as well.  Avoid mixing alcohol, drugs, and sex. Alcohol and drug use can affect your ability to make good decisions and can lead to risky sexual behaviors.  Ask your health care provider about taking pre-exposure prophylaxis (PrEP) to prevent HIV infection if you: ? Have a HIV-positive sexual partner. ? Have multiple sexual  partners or partners who do not know their HIV status, and do not regularly use a condom during sex. ? Use injection drugs and share needles. Birth control pills, injections, implants, and intrauterine devices (IUDs) do not protect against STIs. To prevent both STIs and pregnancy, always use a condom with another form of birth control. Some STIs, such as herpes, are spread through skin to skin contact. A condom does not protect you from getting such STIs. If you or your partner have herpes and there is an active flare with open sores, avoid all sexual contact. Why are these changes important? Taking steps to practice safe sex protects you and others. Many STIs can be cured. However, some STIs are not curable and will affect you for the rest of your life. STIs can be passed on to another person even if you do not have symptoms. What can happen if changes are not made? Certain STIs may:  Require you to take medicine for the rest of your life.  Affect your ability to have children (your fertility).  Increase your risk for developing another STI or certain serious health conditions, such as: ? Cervical cancer. ? Head and neck cancer. ? Pelvic inflammatory disease (PID) in women. ? Organ damage or damage to other parts of your body, if the infection spreads.  Be passed to a baby during childbirth. How are sexually transmitted infections treated? If you or your partner know or think that you may have an STI:  Talk with your health care provider about what can be done to treat it. Some STIs can be treated and cured with medicines.  For curable STIs, you and   your partner should avoid sex during treatment and for several days after treatment is complete.  You and your partner should both be treated at the same time, if there is any chance that your partner is infected as well. If you get treatment but your partner does not, your partner can re-infect you when you resume sexual contact.  Do not  have unprotected sex. Where to find more information Learn more about sexually transmitted diseases and infections from:  Centers for Disease Control and Prevention: ? More information about specific STIs: SolutionApps.co.zawww.cdc.gov/std ? Find places to get sexual health counseling and treatment for free or for a low cost: gettested.TonerPromos.nocdc.gov  U.S. Department of Health and Human Services: NotebookPreviews.siwww.womenshealth.gov/publications/our-publications/fact-sheet/sexually-transmitted-infections.html Summary  The only way to completely prevent STIs is not to have sex (practice abstinence), including oral, vaginal, or anal sex.  STIs can spread through saliva, semen, blood, vaginal mucus, urine, or sexual contact.  If you do have sex, limit your number of sexual partners and use a barrier protection method every time you have sex.  If you develop an STI, get treated right away and ask your partner to be treated as well. Do not resume having sex until both of you have completed treatment for the STI. This information is not intended to replace advice given to you by your health care provider. Make sure you discuss any questions you have with your health care provider. Document Released: 05/24/2016 Document Revised: 11/01/2017 Document Reviewed: 05/24/2016 Elsevier Patient Education  2020 [employer] Primary Amenorrhea  Primary amenorrhea is when a female has not started having periods by the age of 15 years. What are the causes? This condition may be caused by:  An abnormal chromosome that causes the ovaries not to work properly (common).  Malnutrition.  Low blood sugar (hypoglycemia).  Polycystic ovary syndrome.  Being born without a vagina, uterus, or ovaries.  Extreme obesity.  Cystic fibrosis.  Drastic weight loss.  Over-exercising that leads to a loss of body fat.  Pituitary gland tumor in the brain.  Long-term illnesses.  Cushing disease.  A thyroid disease, such as hypothyroidism or  hyperthyroidism.  A part of the brain called the hypothalamus not working normally.  Premature ovarian failure. What are the signs or symptoms? The main symptom of this condition is not having had a period by age 38 years. Other symptoms include:  Discharge from the breasts.  Hot flashes.  Adult acne.  Facial or chest hair.  Headaches.  Impaired vision.  Recent excessive stress.  Changes in weight, diet, or exercise patterns. How is this diagnosed? This condition may be diagnosed based on:  Your medical history.  A physical exam.  Tests, such as: ? Blood tests. ? Urine tests. How is this treated? Treatment for this condition depends on the cause. For example, an absence of sex organs will require surgery to correct the problem. Others causes may respond when treated with medicine. Follow these instructions at home:  Maintain a healthy diet.  Maintain a healthy weight.  Exercise regularly but not excessively.  Take over-the-counter and prescription medicines only as told by your health care provider.  Keep all follow-up visits as told by your health care provider. This is important. Contact a health care provider if:  Your body is not developing at the level of your peers.  You have pelvic pain.  You gain an unusual amount of weight.  You have an unusual amount of hair growth. Summary  Primary amenorrhea is when a female  has not started having periods by the age of 68 years.  This condition has many causes, including abnormal ovaries, obesity, extreme weight loss.  Contact a health care provider if your body is not developing at the level of your peers. This information is not intended to replace advice given to you by your health care provider. Make sure you discuss any questions you have with your health care provider. Document Released: 05/28/2005 Document Revised: 11/07/2017 Document Reviewed: 08/14/2016 Elsevier Patient Education  [place].
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
## 2019-02-11 LAB — VAGINITIS/VAGINOSIS, DNA PROBE
Candida Species: NEGATIVE
Gardnerella vaginalis: NEGATIVE
Trichomonas vaginosis: NEGATIVE

## 2019-02-11 LAB — TSH: TSH: 1.17 u[IU]/mL (ref 0.450–4.500)

## 2019-02-11 LAB — FOLLICLE STIMULATING HORMONE: FSH: 54.5 m[IU]/mL

## 2019-02-11 LAB — PROLACTIN: Prolactin: 7.1 ng/mL (ref 4.8–23.3)

## 2019-02-11 LAB — HCG, SERUM, QUALITATIVE: hCG,Beta Subunit,Qual,Serum: NEGATIVE m[IU]/mL (ref <6)

## 2019-02-11 LAB — HIV ANTIBODY (ROUTINE TESTING W REFLEX): HIV Screen 4th Generation wRfx: NONREACTIVE

## 2019-02-11 LAB — RPR: RPR Ser Ql: NONREACTIVE

## 2019-02-11 LAB — HEPATITIS C ANTIBODY: Hep C Virus Ab: <0.1 s/co ratio (ref 0.0–0.9)

## 2019-02-12 ENCOUNTER — Encounter: Payer: Self-pay | Admitting: Physical Therapy

## 2019-02-12 ENCOUNTER — Other Ambulatory Visit: Payer: Self-pay

## 2019-02-12 ENCOUNTER — Ambulatory Visit (INDEPENDENT_AMBULATORY_CARE_PROVIDER_SITE_OTHER): Payer: 59 | Admitting: Rehabilitative and Restorative Service Providers"

## 2019-02-12 DIAGNOSIS — M62830 Muscle spasm of back: Secondary | ICD-10-CM | POA: Diagnosis not present

## 2019-02-12 DIAGNOSIS — M542 Cervicalgia: Secondary | ICD-10-CM | POA: Diagnosis not present

## 2019-02-12 DIAGNOSIS — M545 Low back pain, unspecified: Secondary | ICD-10-CM

## 2019-02-12 DIAGNOSIS — R42 Dizziness and giddiness: Secondary | ICD-10-CM
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
## 2019-02-13 ENCOUNTER — Telehealth: Payer: Self-pay | Admitting: Certified Nurse Midwife

## 2019-02-13 NOTE — Telephone Encounter (Signed)
Patient is calling for her "culture results" from 02/10/19.

## 2019-02-13 NOTE — Telephone Encounter (Signed)
Dr. Miller -please review 02/10/19 vaginitis results and advise.  

## 2019-02-15 NOTE — Telephone Encounter (Signed)
Results sent to you to call.  Looks like pt has an elevated FSH (possible premature ovarian insufficiency).  She needs MD visit.  I wrote this all in the results note.  Thanks.

## 2019-02-17 NOTE — Telephone Encounter (Signed)
-----   Message from Megan Salon, MD sent at 02/15/2019 10:34 PM EDT ----- Please let pt know her vaginitis testing, RPR, HIV, Hep C, GC/Chl and trich were all negative.    TSH and prolactin are normal.  FSH is elevated indicated low ovarian reserve/possible early menopause.  Needs OV with MD.  I'm glad to see her since I've reviewed records.  She does need some specific and additional blood work done at this time.

## 2019-02-17 NOTE — Telephone Encounter (Signed)
Spoke with patient, advised as seen below per Dr. Sabra Heck. Patient request to proceed with OV for consult. OV scheduled for 9/9 at 11:30am, patient declines earlier appt, has another appt. [id number] prescreen negative.   Routing to provider for final review. Patient is agreeable to disposition. Will close encounter.

## 2019-02-18 ENCOUNTER — Encounter: Payer: 59 | Admitting: Rehabilitative and Restorative Service Providers"

## 2019-02-18 ENCOUNTER — Encounter: Payer: Self-pay | Admitting: Obstetrics & Gynecology

## 2019-02-18 ENCOUNTER — Other Ambulatory Visit: Payer: Self-pay | Admitting: Obstetrics & Gynecology

## 2019-02-18 ENCOUNTER — Ambulatory Visit: Payer: 59 | Admitting: Obstetrics & Gynecology

## 2019-02-18 ENCOUNTER — Other Ambulatory Visit: Payer: Self-pay

## 2019-02-18 VITALS — BP 116/80 | HR 68 | Temp 97.3°F | Ht 62.0 in | Wt 245.0 lb

## 2019-02-18 DIAGNOSIS — R7989 Other specified abnormal findings of blood chemistry: Secondary | ICD-10-CM

## 2019-02-18 DIAGNOSIS — E349 Endocrine disorder, unspecified: Secondary | ICD-10-CM | POA: Diagnosis not present

## 2019-02-18 DIAGNOSIS — R748 Abnormal levels of other serum enzymes: Secondary | ICD-10-CM
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
## 2019-02-18 NOTE — Addendum Note (Signed)
Addended by: Megan Salon on: 02/18/2019 12:24 PM   Modules accepted: Orders

## 2019-02-19 ENCOUNTER — Ambulatory Visit (INDEPENDENT_AMBULATORY_CARE_PROVIDER_SITE_OTHER): Payer: 59 | Admitting: Psychology

## 2019-02-19 DIAGNOSIS — F320 Major depressive disorder, single episode, mild: Secondary | ICD-10-CM

## 2019-02-21 LAB — HEPATIC FUNCTION PANEL
ALT: 25 IU/L (ref 0–32)
AST: 25 IU/L (ref 0–40)
Albumin: 4.2 g/dL (ref 3.8–4.8)
Alkaline Phosphatase: 61 IU/L (ref 39–117)
Bilirubin Total: 0.3 mg/dL (ref 0.0–1.2)
Bilirubin, Direct: 0.07 mg/dL (ref 0.00–0.40)
Total Protein: 6.6 g/dL (ref 6.0–8.5)

## 2019-02-21 LAB — ESTRADIOL: Estradiol: 6.8 pg/mL

## 2019-02-21 LAB — ANTI MULLERIAN HORMONE: ANTI-MULLERIAN HORMONE (AMH): <0.015 ng/mL

## 2019-02-23 ENCOUNTER — Encounter: Payer: 59 | Admitting: Rehabilitative and Restorative Service Providers"

## 2019-02-23 ENCOUNTER — Ambulatory Visit (INDEPENDENT_AMBULATORY_CARE_PROVIDER_SITE_OTHER): Payer: 59 | Admitting: Rehabilitative and Restorative Service Providers"

## 2019-02-23 ENCOUNTER — Other Ambulatory Visit: Payer: Self-pay

## 2019-02-23 DIAGNOSIS — M542 Cervicalgia: Secondary | ICD-10-CM | POA: Diagnosis not present

## 2019-02-23 DIAGNOSIS — M545 Low back pain, unspecified: Secondary | ICD-10-CM

## 2019-02-23 DIAGNOSIS — M62830 Muscle spasm of back: Secondary | ICD-10-CM | POA: Diagnosis not present
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
## 2019-02-25 ENCOUNTER — Encounter: Payer: 59 | Admitting: Rehabilitative and Restorative Service Providers"

## 2019-02-25 ENCOUNTER — Encounter: Payer: Self-pay | Admitting: Rehabilitative and Restorative Service Providers"

## 2019-02-25 ENCOUNTER — Other Ambulatory Visit: Payer: Self-pay

## 2019-02-25 ENCOUNTER — Ambulatory Visit (INDEPENDENT_AMBULATORY_CARE_PROVIDER_SITE_OTHER): Payer: 59 | Admitting: Rehabilitative and Restorative Service Providers"

## 2019-02-25 DIAGNOSIS — M545 Low back pain, unspecified: Secondary | ICD-10-CM

## 2019-02-25 DIAGNOSIS — M542 Cervicalgia: Secondary | ICD-10-CM

## 2019-02-25 DIAGNOSIS — M62830 Muscle spasm of back: Secondary | ICD-10-CM

## 2019-02-25 NOTE — Therapy (Signed)
[facility] [place], [place], [address] Phone: [phone]   Fax:  [phone]  Physical Therapy Treatment  Patient Details  Name: Julia Schultz MRN: [id number] Date of Birth: 1980/09/26 Referring Provider (PT): Dr Charlann Boxer    Encounter Date: 02/25/2019  PT End of Session - 02/25/19 1149    Visit Number  17    Number of Visits  24    Date for PT Re-Evaluation  03/11/19    PT Start Time  1148    PT Stop Time  1230    PT Time Calculation (min)  42 min    Activity Tolerance  Patient tolerated treatment well       Past Medical History:  Diagnosis Date  . 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 Anemia   . Chronic cholecystitis with calculus   . Complication of anesthesia    woke as a child during procedure  . GERD (gastroesophageal reflux disease)   . Headache    migraines  . Menorrhagia   . Thrombocytopenia ([place]) 2005    Past Surgical History:  Procedure Laterality Date  . BREAST SURGERY  2003   breast reduction   . CESAREAN SECTION     x 3  . CHOLECYSTECTOMY N/A 08/10/2015   Procedure: LAPAROSCOPIC CHOLECYSTECTOMY WITH INTRAOPERATIVE CHOLANGIOGRAM;  Surgeon: Donnie Mesa, MD;  Location: [place];  Service: General;  Laterality: N/A;  . COLPOSCOPY  2018  . FOOT SURGERY Left child 4th grade   correct arch  . TUBAL LIGATION Bilateral 2010    There were no vitals filed for this visit.  Subjective Assessment - 02/25/19 1153    Subjective  Patient reports that she is "better". She is not haveing pain but has some "aching" in the mid back area.    Currently in Pain?  Yes    Pain Score  1     Pain Location  Neck    Pain Orientation  Right;Left    Pain Descriptors / Indicators  Aching    Pain Type  Acute pain    Pain Onset  More than a month ago    Pain Frequency  Constant    Aggravating Factors   prolonged driving    Pain Relieving Factors  epsom salt bath; massage    Pain  Score  1    Pain Location  Back    Pain Orientation  Right;Left    Pain Descriptors / Indicators  Aching    Pain Type  Acute pain    Pain Onset  More than a month ago    Pain Frequency  Intermittent    Aggravating Factors   prolonged driving; standing; walking    Pain Relieving Factors  epsom salt bath         [facility] PT Assessment - 02/25/19 0001      Assessment   Medical Diagnosis  Neck and Back Pain    Referring Provider (PT)  Dr Charlann Boxer     Hand Dominance  Right    Prior Therapy  no      AROM   Overall AROM Comments  Cervical ROM WFL's and pain free;     Lumbar Flexion  90% sore    Lumbar Extension  80%       Strength   Overall Strength Comments  WFL's bilat LE's - requiress continued work for improved core stabilization       Palpation  Palpation comment  mild muscular tightness cervical paraspinals; UT. Lumbar spine WFL's - some soreness                   [facility] Adult PT Treatment/Exercise - 02/25/19 0001      Neck Exercises: Seated   Lateral Flexion  Right;Left;5 reps   10 sec hold    W Back  10 reps    Shoulder Rolls  Backwards;10 reps    Other Seated Exercise  reaching up w/ one UE over to opposite side; cross arm stretch; upper spine rotation; forward one vetebrae at a time reaching forward toward floor 3 reps each       Lumbar Exercises: Stretches   Other Lumbar Stretch Exercise  midback stretch seated reaching forward for floor rolling down slowly hands btn knees     Other Lumbar Stretch Exercise  doorway stretch 30 sec x 3 reps x 3 positions       Lumbar Exercises: Aerobic   Nustep  L5: 8 min (arms and legs)       Lumbar Exercises: Standing   Scapular Retraction  Strengthening;Both;20 reps;Theraband    Theraband Level (Scapular Retraction)  Level 4 (Blue)    Row  Strengthening;Both;20 reps;Theraband    Theraband Level (Row)  Level 4 (Blue)    Row Limitations  bow and arrow x 10 each side - blue TB     Shoulder Extension   Strengthening;Both;20 reps;Theraband    Theraband Level (Shoulder Extension)  Level 4 (Blue)      Lumbar Exercises: Seated   Sit to Stand  10 reps   slow eccentric lowering stand to sit    Other Seated Lumbar Exercises  lateral trunk flexion 20 sec hold  3 reps each side       Neck Exercises: Stretches   Chest Stretch  1 rep;30 seconds   lying supine on yoga egg to mobilize mid back    Other Neck Stretches  horizontal shoulder adduction 15 sec hold x 2 each UE     Other Neck Stretches  rhomboid/ neck stretch with self hug and rolling down to lap x 3 reps                PT Short Term Goals - 01/19/19 1712      PT SHORT TERM GOAL #1   Title  Pt to be independent with initial HEP    Time  6    Period  Weeks    Status  Achieved    Target Date  01/14/19      PT SHORT TERM GOAL #2   Title  Pt to report decreased pain in neck to 5/10 with activity    Status  Achieved        PT Long Term Goals - 01/28/19 1534      PT LONG TERM GOAL #1   Title  Pt to be independent with long term HEP    Time  6    Period  Weeks    Status  On-going    Target Date  03/11/19      PT LONG TERM GOAL #2   Title  Pt to report decreased pain to 0-2/10 in neck and back, to improve ability for work duties.    Time  6    Period  Weeks    Status  On-going    Target Date  03/11/19      PT LONG TERM GOAL #3   Title  Pt  to demo soft tissue restrictions in cervical region to be WNL, to improve pain and mobility.    Time  6    Period  Weeks    Status  On-going    Target Date  03/11/19      PT LONG TERM GOAL #4   Title  Pt to demo cervical and lumbar ROM to be [facility] to improve ability for driving and IADLS.    Time  6    Period  Weeks    Status  Achieved      PT LONG TERM GOAL #5   Title  report 75% improvement in vestibular symptoms for improved function    Status  Achieved            Plan - 02/25/19 1151    Clinical Impression Statement  Patient reports some continued aching or  soreness in the midback but no pain. She is working on her exercises at home and work. She demonstrates normal mobility and ROM through the spine; Strength is WNL's and she has returned to normal functional activities. Patient will be discharged to independent HEP.    Examination-Activity Limitations  Sleep;Bend;Squat;Caring for Others;Carry;Stand;Lift;Reach Overhead    Examination-Participation Restrictions  Cleaning;Community Activity;Driving    Stability/Clinical Decision Making  Evolving/Moderate complexity    Rehab Potential  Good    PT Frequency  2x / week    PT Duration  4 weeks    PT Treatment/Interventions  ADLs/Self Care Home Management;Cryotherapy;Electrical Stimulation;Iontophoresis 95m/ml Dexamethasone;Moist Heat;Therapeutic activities;Ultrasound;Traction;Therapeutic exercise;Neuromuscular re-education;Patient/family education;Taping;Dry needling;Passive range of motion;Manual techniques;Vestibular;Visual/perceptual remediation/compensation;Joint Manipulations;Spinal Manipulations;Gait training;Stair training;Functional mobility training;Canalith Repostioning    PT Next Visit Plan  d/c to independent HEP; note to MD    [place] Vestibular Access Code: [email or web address]   Consulted and Agree with Plan of Care  Patient       Patient will benefit from skilled therapeutic intervention in order to improve the following deficits and impairments:  Decreased activity tolerance, Decreased strength, Pain, Decreased mobility, Increased muscle spasms, Improper body mechanics, Decreased range of motion, Impaired flexibility, Dizziness  Visit Diagnosis: Cervicalgia  Muscle spasm of back  Acute bilateral low back pain without sciatica     Problem List Patient Active Problem List   Diagnosis Date Noted  . Nonallopathic lesion of cervical region 01/27/2019  . Nonallopathic lesion of thoracic region 01/27/2019  . Nonallopathic lesion of lumbosacral region 01/27/2019  .  Nonallopathic lesion of rib cage 01/27/2019  . Nonallopathic lesion of sacral region 01/27/2019  . Anxiety 12/31/2018  . Whiplash injury 11/26/2018  . Mild concussion 11/11/2018  . Elevated LFTs 11/10/2018  . Thrombocytopenia ([facility] 08/16/2017  . Vitamin D deficiency 08/16/2017    Mekhai Venuto PNilda SimmerPT, MPH  02/25/2019, 12:44 PM  [facility] [address]Phone: [phone]  Fax:  [phone] Name: NLeafy MotsingerMRN: [id number] of Birth: 312-10-1980 PHYSICAL THERAPY DISCHARGE SUMMARY  Visits from Start of Care: 17  Current functional level related to goals / functional outcomes: See progress note for discharge status   Remaining deficits: Some aching or soreness in the thoracic area   Education / Equipment: HEP  Plan: Patient agrees to discharge.  Patient goals were met. Patient is being discharged due to meeting the stated rehab goals.  ?????    Chasin Findling P. HHelene KelpPT, MPH 02/25/19 12:44 PM

## 2019-02-26 ENCOUNTER — Encounter: Payer: Self-pay | Admitting: Family Medicine

## 2019-02-26 ENCOUNTER — Ambulatory Visit: Payer: 59 | Admitting: Family Medicine

## 2019-02-26 VITALS — BP 130/90 | HR 70 | Ht 62.0 in | Wt 246.0 lb

## 2019-02-26 DIAGNOSIS — S134XXD Sprain of ligaments of cervical spine, subsequent encounter: Secondary | ICD-10-CM | POA: Diagnosis not present

## 2019-02-26 DIAGNOSIS — M999 Biomechanical lesion, unspecified: Secondary | ICD-10-CM

## 2019-02-26 NOTE — Patient Instructions (Signed)
Good to see you Continue the exercises We will see you again in 6-8 weeks

## 2019-02-26 NOTE — Progress Notes (Signed)
[facility] [place],  [address] Phone: [phone] Subjective:   I Julia Schultz am serving as a Education administrator for Dr. Hulan Saas.   CC: Neck pain  [id number]  Julia Schultz is a 38 y.o. female coming in with complaint of neck pain. States she is feeling sore but overall she is better.  Patient feels like she is making progress.  Not having as much pain.  Feels physical therapy has been very helpful.  With states that she is 85% better.    Past Medical History:  Diagnosis Date  . 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 Anemia   . Chronic cholecystitis with calculus   . Complication of anesthesia    woke as a child during procedure  . GERD (gastroesophageal reflux disease)   . Headache    migraines  . Menorrhagia   . Thrombocytopenia ([place]) 2005   Past Surgical History:  Procedure Laterality Date  . BREAST SURGERY  2003   breast reduction   . CESAREAN SECTION     x 3  . CHOLECYSTECTOMY N/A 08/10/2015   Procedure: LAPAROSCOPIC CHOLECYSTECTOMY WITH INTRAOPERATIVE CHOLANGIOGRAM;  Surgeon: Donnie Mesa, MD;  Location: [place];  Service: General;  Laterality: N/A;  . COLPOSCOPY  2018  . FOOT SURGERY Left child 4th grade   correct arch  . TUBAL LIGATION Bilateral 2010   Social History   Socioeconomic History  . Marital status: Legally Separated    Spouse name: Not on file  . Number of children: Not on file  . Years of education: Not on file  . Highest education level: Not on file  Occupational History  . Not on file  Social Needs  . Financial resource strain: Not on file  . Food insecurity    Worry: Not on file    Inability: Not on file  . Transportation needs    Medical: Not on file    Non-medical: Not on file  Tobacco Use  . Smoking status: Never Smoker  . Smokeless tobacco: Never Used  Substance and Sexual Activity  . Alcohol use: Yes    Comment: occ  . Drug use: No  . Sexual activity: Yes     Partners: Male    Birth control/protection: Surgical    Comment: BTL  Lifestyle  . Physical activity    Days per week: Not on file    Minutes per session: Not on file  . Stress: Not on file  Relationships  . Social Herbalist on phone: Not on file    Gets together: Not on file    Attends religious service: Not on file    Active member of club or organization: Not on file    Attends meetings of clubs or organizations: Not on file    Relationship status: Not on file  Other Topics Concern  . Not on file  Social History Narrative  . Not on file   Allergies  Allergen Reactions  . Banana Anaphylaxis    "lump in throat"  . Latex Itching  . Other     Walnuts cause itchy throat  . Tomato Hives and Swelling   Family History  Problem Relation Age of Onset  . Hyperlipidemia Mother   . Hypertension Mother   . Diabetes Mother   . Diverticulitis Mother   . Pancreatic cancer Father          Current Outpatient Medications (  Other):  Marland Kitchen.  Omega-3 Fatty Acids (FISH OIL PO), Take by mouth.    Past medical history, social, surgical and family history all reviewed in electronic medical record.  No pertanent information unless stated regarding to the chief complaint.   Review of Systems:  No headache, visual changes, nausea, vomiting, diarrhea, constipation, dizziness, abdominal pain, skin rash, fevers, chills, night sweats, weight loss, swollen lymph nodes, body aches, joint swelling,chest pain, shortness of breath, mood changes.  Mild positive muscle aches  Objective  Blood pressure 130/90, pulse 70, height 5\' 2"  (1.575 m), weight 246 lb (111.6 kg), SpO2 97 %.     General: No apparent distress alert and oriented x3 mood and affect normal, dressed appropriately.  HEENT: Pupils equal, extraocular movements intact  Respiratory: Patient's speak in full sentences and does not appear short of breath  Cardiovascular: No lower extremity edema, non tender, no erythema  Skin:  Warm dry intact with no signs of infection or rash on extremities or on axial skeleton.  Abdomen: Soft nontender  Neuro: Cranial nerves II through XII are intact, neurovascularly intact in all extremities with 2+ DTRs and 2+ pulses.  Lymph: No lymphadenopathy of posterior or anterior cervical chain or axillae bilaterally.  Gait normal with good balance and coordination.  MSK:  Non tender with full range of motion and good stability and symmetric strength and tone of shoulders, elbows, wrist, hip, knee and ankles bilaterally.  Neck exam still lacks last 5 degrees of extension.  Mild tightness in the parascapular region.  Negative Spurling's.  Full strength of the upper extremity bilaterally  Osteopathic findings  C2 flexed rotated and side bent right C4 flexed rotated and side bent left C6 flexed rotated and side bent left T3 extended rotated and side bent right inhaled third rib T9 extended rotated and side bent left L2 flexed rotated and side bent right Sacrum right on right    Impression and Recommendations:     This case required medical decision making of moderate complexity. The above documentation has been reviewed and is accurate and complete Judi SaaZachary M Smith, DO       Note: This dictation was prepared with Dragon dictation along with smaller phrase technology. Any transcriptional errors that result from this process are unintentional.

## 2019-02-27 ENCOUNTER — Encounter: Payer: Self-pay | Admitting: Family Medicine

## 2019-02-27 ENCOUNTER — Other Ambulatory Visit: Payer: Self-pay | Admitting: Obstetrics & Gynecology

## 2019-02-27 ENCOUNTER — Other Ambulatory Visit: Payer: Self-pay

## 2019-02-27 ENCOUNTER — Other Ambulatory Visit (INDEPENDENT_AMBULATORY_CARE_PROVIDER_SITE_OTHER): Payer: 59

## 2019-02-27 ENCOUNTER — Other Ambulatory Visit: Payer: Self-pay | Admitting: *Deleted

## 2019-02-27 DIAGNOSIS — E2839 Other primary ovarian failure: Secondary | ICD-10-CM

## 2019-02-27 DIAGNOSIS — E288 Other ovarian dysfunction: Secondary | ICD-10-CM

## 2019-02-27 NOTE — Assessment & Plan Note (Signed)
Patient is making significant improvement and I think will do well.  Patient seems to be emotionally more stable than has been in the past.  Discussed which activities to do which wants to avoid an increase in activity slowly.  Follow-up again with me in 4 to 8 weeks

## 2019-02-27 NOTE — Progress Notes (Signed)
Lab orders placed.  

## 2019-02-27 NOTE — Assessment & Plan Note (Signed)
Decision today to treat with OMT was based on Physical Exam  After verbal consent patient was treated with HVLA, ME, FPR techniques in cervical, thoracic, rib,  lumbar and sacral areas  Patient tolerated the procedure well with improvement in symptoms  Patient given exercises, stretches and lifestyle modifications  See medications in patient instructions if given  Patient will follow up in 4-8 weeks 

## 2019-03-02 ENCOUNTER — Other Ambulatory Visit: Payer: Self-pay

## 2019-03-02 ENCOUNTER — Other Ambulatory Visit (INDEPENDENT_AMBULATORY_CARE_PROVIDER_SITE_OTHER): Payer: 59

## 2019-03-02 ENCOUNTER — Other Ambulatory Visit: Payer: 59

## 2019-03-02 DIAGNOSIS — E288 Other ovarian dysfunction: Secondary | ICD-10-CM

## 2019-03-03 LAB — ANTIADRENAL ANTIBODIES, QUANT: Adrenal Total AutoAb: NEGATIVE

## 2019-03-07 LAB — 21-HYDROXYLASE ANTIBODIES: 21-Hydroxylase Antibodies: NEGATIVE

## 2019-03-13 LAB — CHROMOSOME, BLOOD, ROUTINE
Cells Analyzed: 20
Cells Counted: 20
Cells Karyotyped: 2
GTG Band Resolution Achieved: 500

## 2019-03-16 ENCOUNTER — Telehealth: Payer: Self-pay | Admitting: *Deleted

## 2019-03-16 NOTE — Telephone Encounter (Signed)
LM for pt to call back.

## 2019-03-16 NOTE — Telephone Encounter (Signed)
-----   Message from Megan Salon, MD sent at 03/15/2019 11:26 PM EDT ----- Please let pt know her chromosome testing and adrenal antibody testing was negative.  This is all good.  She does need a bone density for baseline study and discussion of whether she wants to take any estrogen to help with bone health over the time now until when typical menopause occurs.  Consultation would be best for this discussion.  Ok to have video visit if she desires.  Thanks.

## 2019-03-18 ENCOUNTER — Ambulatory Visit: Payer: 59 | Admitting: Physician Assistant

## 2019-03-18 ENCOUNTER — Encounter: Payer: Self-pay | Admitting: Physician Assistant

## 2019-03-18 ENCOUNTER — Other Ambulatory Visit: Payer: Self-pay

## 2019-03-18 VITALS — BP 120/80 | HR 77 | Temp 98.0°F | Ht 62.0 in | Wt 249.2 lb

## 2019-03-18 DIAGNOSIS — L0291 Cutaneous abscess, unspecified: Secondary | ICD-10-CM | POA: Diagnosis not present

## 2019-03-18 NOTE — Progress Notes (Signed)
Julia Schultz is a 38 y.o. female here for a follow up of a pre-existing problem.  I acted as a Education administrator for [employer], PA-C Anselmo Pickler, LPN  History of Present Illness:   Chief Complaint  Patient presents with  . Abscess    HPI   Abscess Pt went to Urgent care on 9/29 and had an abscess drained and packed on left side of back. She has been back 4 times and had packing removed and replace. Last time was Oct 5th and was told to f/u with PCP. Packing in place. She was on Doxycycline 100 mg BID x 7 days and finished yesterday.  She also reports that she received an antibiotic injection while in urgent care, ?rocephin. overall she feels like her symptoms are improving, she states that her pain is improving each day, mostly has pain when she has dressing changes.  Denies any further discharge from the wound, fever, chills, swelling.  Past Medical History:  Diagnosis Date  . 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 Anemia   . Chronic cholecystitis with calculus   . Complication of anesthesia    woke as a child during procedure  . GERD (gastroesophageal reflux disease)   . Headache    migraines  . Menorrhagia   . Thrombocytopenia ([place]) 2005     Social History   Socioeconomic History  . Marital status: Legally Separated    Spouse name: Not on file  . Number of children: Not on file  . Years of education: Not on file  . Highest education level: Not on file  Occupational History  . Not on file  Social Needs  . Financial resource strain: Not on file  . Food insecurity    Worry: Not on file    Inability: Not on file  . Transportation needs    Medical: Not on file    Non-medical: Not on file  Tobacco Use  . Smoking status: Never Smoker  . Smokeless tobacco: Never Used  Substance and Sexual Activity  . Alcohol use: Yes    Comment: occ  . Drug use: No  . Sexual activity: Yes    Partners: Male    Birth control/protection: Surgical    Comment: BTL   Lifestyle  . Physical activity    Days per week: Not on file    Minutes per session: Not on file  . Stress: Not on file  Relationships  . Social Herbalist on phone: Not on file    Gets together: Not on file    Attends religious service: Not on file    Active member of club or organization: Not on file    Attends meetings of clubs or organizations: Not on file    Relationship status: Not on file  . Intimate partner violence    Fear of current or ex partner: Not on file    Emotionally abused: Not on file    Physically abused: Not on file    Forced sexual activity: Not on file  Other Topics Concern  . Not on file  Social History Narrative  . Not on file    Past Surgical History:  Procedure Laterality Date  . BREAST SURGERY  2003   breast reduction   . CESAREAN SECTION     x 3  . CHOLECYSTECTOMY N/A 08/10/2015   Procedure: LAPAROSCOPIC CHOLECYSTECTOMY WITH INTRAOPERATIVE CHOLANGIOGRAM;  Surgeon: Donnie Mesa, MD;  Location: [facility];  Service: General;  Laterality: N/A;  . COLPOSCOPY  2018  . FOOT SURGERY Left child 4th grade   correct arch  . TUBAL LIGATION Bilateral 2010    Family History  Problem Relation Age of Onset  . Hyperlipidemia Mother   . Hypertension Mother   . Diabetes Mother   . Diverticulitis Mother   . Pancreatic cancer Father     Allergies  Allergen Reactions  . Banana Anaphylaxis    "lump in throat"  . Latex Itching  . Other     Walnuts cause itchy throat  . Tomato Hives and Swelling    Current Medications:   Current Outpatient Medications:  .  Omega-3 Fatty Acids (FISH OIL PO), Take by mouth., Disp: , Rfl:    Review of Systems:   ROS  Negative unless otherwise specified per HPI.   Vitals:   Vitals:   03/18/19 1017  BP: 120/80  Pulse: 77  Temp: 98 F (36.7 C)  TempSrc: Temporal  SpO2: 99%  Weight: 249 lb 4 oz (113.1 kg)  Height: 5\' 2"  (1.575 m)     Body mass index is 45.59 kg/m.  Physical Exam:   Physical  Exam Constitutional:      Appearance: She is well-developed.  HENT:     Head: Normocephalic and atraumatic.  Eyes:     Conjunctiva/sclera: Conjunctivae normal.  Neck:     Musculoskeletal: Normal range of motion and neck supple.  Pulmonary:     Effort: Pulmonary effort is normal.  Musculoskeletal: Normal range of motion.  Skin:    General: Skin is warm and dry.     Comments: Left lower flank: Small approximately half centimeter well approximated open wound.  Approximately 0.75 cm deep.  No active discharge, odor.  Neurological:     Mental Status: She is alert and oriented to person, place, and time.  Psychiatric:        Behavior: Behavior normal.        Thought Content: Thought content normal.        Judgment: Judgment normal.    Packing was removed.  I have repacked the area with iodoform dressing, and reapplied bandage.  Assessment and Plan:   Aspen was seen today for abscess.  Diagnoses and all orders for this visit:  Abscess   Appears to be here healing very well.  I recommended that we repack area, which she was agreeable to, and that each day she manages the wound by removing small amount of string and cutting daily.  Worsening precautions were advised, and return parameters were given.  Patient verbalized understanding to plan.  I do not see any evidence of worsening infection or need to extend antibiotics at this time.  . Reviewed expectations re: course of current medical issues. . Discussed self-management of symptoms. . Outlined signs and symptoms indicating need for more acute intervention. . Patient verbalized understanding and all questions were answered. . See orders for this visit as documented in the electronic medical record. . Patient received an After-Visit Summary.  CMA or LPN served as scribe during this visit. History, Physical, and Plan performed by medical provider. The above documentation has been reviewed and is accurate and complete.   Raye Sorrow, PA-C

## 2019-03-18 NOTE — Patient Instructions (Signed)
It was great to see you!  Every day, please have someone wash their hands, remove the bandage, use clean scissors, and cut 1 cm off of the wound packing.  Then have it redressed.  Continue this daily until the packing is gone.  Follow-up with me if you have any concerns, especially if any worsening signs of infection.  Take care,  Inda Coke PA-C

## 2019-03-19 NOTE — Telephone Encounter (Signed)
Called patient. Unable to leave message. Voicemail full.  

## 2019-03-19 NOTE — Telephone Encounter (Signed)
Patient returning Julia Schultz's call. °

## 2019-03-19 NOTE — Telephone Encounter (Signed)
Pt notified. Verbalized understanding. Video visit scheduled for 04/02/19

## 2019-03-24 ENCOUNTER — Ambulatory Visit: Payer: 59 | Admitting: Psychology

## 2019-04-02 ENCOUNTER — Encounter: Payer: Self-pay | Admitting: Obstetrics & Gynecology

## 2019-04-02 ENCOUNTER — Telehealth (INDEPENDENT_AMBULATORY_CARE_PROVIDER_SITE_OTHER): Payer: 59 | Admitting: Obstetrics & Gynecology

## 2019-04-02 DIAGNOSIS — E2839 Other primary ovarian failure: Secondary | ICD-10-CM | POA: Diagnosis not present

## 2019-04-02 MED ORDER — NORETHIN ACE-ETH ESTRAD-FE 1-20 MG-MCG PO TABS: 1.0000 | ORAL_TABLET | Freq: Every day | ORAL | 1 refills | Status: DC
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
## 2019-04-03 ENCOUNTER — Encounter: Payer: Self-pay | Admitting: Obstetrics & Gynecology

## 2019-04-15 ENCOUNTER — Ambulatory Visit: Payer: 59 | Admitting: Family Medicine

## 2019-05-06 ENCOUNTER — Telehealth: Payer: Self-pay

## 2019-05-06 NOTE — Telephone Encounter (Signed)
-----   Message from Megan Salon, MD sent at 04/16/2019  4:41 PM EST ----- Regarding: blood pressure Pt is going to check blood pressures and let me know results in about three weeks.  I sent her a mychart message about this but she didn't read it.  Can you hold this message and call her in two weeks?  Thanks.  Vinnie Level

## 2019-05-06 NOTE — Telephone Encounter (Signed)
Follow up call made to patient regarding blood pressure readings. No answer, left message for patient to call our office back at [phone].

## 2019-07-01 ENCOUNTER — Telehealth: Payer: Self-pay

## 2019-07-01 NOTE — Telephone Encounter (Signed)
Copied from CRM [id number]. Topic: General - Other >> Jul 01, 2019 11:52 AM Dalphine Handing A wrote: Rep from [employer]  would like a callback in regards to office notes with Dr .Terrilee Files for patient. Best contact [phone] fax number [phone]

## 2019-07-10 ENCOUNTER — Other Ambulatory Visit: Payer: Self-pay | Admitting: Obstetrics & Gynecology

## 2019-07-10 DIAGNOSIS — N912 Amenorrhea, unspecified: Secondary | ICD-10-CM

## 2019-07-10 NOTE — Telephone Encounter (Signed)
Spoke to pt. Pt states she did have cycles for the last 3 months since started taking Loestrin Fe in 03/2019. Pt states likes the Rx and would like refill. Has 2 weeks left on current Rx.    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 Pt has been busy and has not been able to go anywhere to take BP. Cant find home BP machine. Pt going to try getting [facility] on weekends [facility]. Pt denies headaches, blurry vision or visual changes.   Routing to Dr Hyacinth Meeker for recommendations and Rx refill. Rx pended if approved. # 1 package, 0RF.

## 2019-07-10 NOTE — Telephone Encounter (Signed)
Patient says the aurobindo worked for her and would like a refill sent to pharmacy. [facility] [phone]. Also want to let Dr.Miller know she has not been keeping up with her blood pressure.

## 2019-07-13 MED ORDER — NORETHIN ACE-ETH ESTRAD-FE 1-20 MG-MCG PO TABS: 1.0000 | ORAL_TABLET | Freq: Every day | ORAL | 0 refills | Status: DC

## 2019-07-20 NOTE — Telephone Encounter (Signed)
See phone note 07/10/19. Closing encounter.

## 2019-08-28 ENCOUNTER — Encounter: Payer: Self-pay | Admitting: Certified Nurse Midwife

## 2019-09-02 ENCOUNTER — Ambulatory Visit (INDEPENDENT_AMBULATORY_CARE_PROVIDER_SITE_OTHER): Payer: 59 | Admitting: Psychology

## 2019-09-02 DIAGNOSIS — F320 Major depressive disorder, single episode, mild: Secondary | ICD-10-CM

## 2019-11-05 ENCOUNTER — Telehealth: Payer: Self-pay | Admitting: *Deleted

## 2019-11-05 DIAGNOSIS — N912 Amenorrhea, unspecified: Secondary | ICD-10-CM

## 2019-11-05 NOTE — Telephone Encounter (Signed)
Left message for pt to return call to triage RN. 

## 2019-11-05 NOTE — Telephone Encounter (Signed)
Patient has a question about her estrogen prescription.

## 2019-11-08 ENCOUNTER — Other Ambulatory Visit: Payer: Self-pay | Admitting: Obstetrics & Gynecology

## 2019-11-08 DIAGNOSIS — N912 Amenorrhea, unspecified: Secondary | ICD-10-CM

## 2019-11-12 NOTE — Telephone Encounter (Signed)
Patient called to check status of refill for Estrogen, patient stated the pharmacy had sent a request. Asked patient if she had any questions regarding the medication patient stated she did not and just needed a refill.

## 2019-11-13 NOTE — Telephone Encounter (Signed)
Patient says she is returning a call. °

## 2019-11-13 NOTE — Telephone Encounter (Signed)
Tried calling patient, no answer. Left message for patient to call back.

## 2019-11-13 NOTE — Telephone Encounter (Signed)
Medication refill request: LOESTRIN FE Last OV:  04/02/19 SM Next AEX: none scheduled Last MMG (if hormonal medication request): n/a Refill authorized: Please advise; order pended #1 pack w/0 refills if authorized

## 2019-11-16 ENCOUNTER — Other Ambulatory Visit: Payer: Self-pay | Admitting: Obstetrics & Gynecology

## 2019-11-16 DIAGNOSIS — N912 Amenorrhea, unspecified: Secondary | ICD-10-CM

## 2019-11-16 MED ORDER — NORETHIN ACE-ETH ESTRAD-FE 1-20 MG-MCG PO TABS: 1.0000 | ORAL_TABLET | Freq: Every day | ORAL | 0 refills | Status: DC

## 2019-11-16 NOTE — Telephone Encounter (Signed)
Spoke with pt. Pt given update on Rx Loestrin sent to pharmacy today. Pt verbalized understanding. Pt scheduled AEX with Dr Hyacinth Meeker on 12/18/19 at 1130 am. Pt verbalized understanding and agreeable to date and time of appt. At this time, will be given new refills. Pt agreeable.   Routing to Dr Hyacinth Meeker for review.  Encounter closed.

## 2019-11-16 NOTE — Telephone Encounter (Signed)
Rx completed but pt does need AEX.  Thanks.
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
## 2019-12-18 ENCOUNTER — Other Ambulatory Visit: Payer: Self-pay

## 2019-12-18 ENCOUNTER — Ambulatory Visit: Payer: 59 | Admitting: Obstetrics & Gynecology

## 2019-12-18 ENCOUNTER — Other Ambulatory Visit (HOSPITAL_COMMUNITY)
Admission: RE | Admit: 2019-12-18 | Discharge: 2019-12-18 | Disposition: A | Discharge status: Home / Self Care | Payer: 59 | Source: Ambulatory Visit | Attending: Obstetrics & Gynecology | Admitting: Obstetrics & Gynecology

## 2019-12-18 ENCOUNTER — Encounter: Payer: Self-pay | Admitting: Obstetrics & Gynecology

## 2019-12-18 ENCOUNTER — Other Ambulatory Visit: Payer: Self-pay | Admitting: Obstetrics & Gynecology

## 2019-12-18 VITALS — BP 120/74 | HR 70 | Resp 16 | Ht 63.75 in | Wt 239.0 lb

## 2019-12-18 DIAGNOSIS — Z0000 Encounter for general adult medical examination without abnormal findings: Secondary | ICD-10-CM | POA: Diagnosis not present

## 2019-12-18 DIAGNOSIS — N912 Amenorrhea, unspecified: Secondary | ICD-10-CM | POA: Diagnosis not present

## 2019-12-18 DIAGNOSIS — Z113 Encounter for screening for infections with a predominantly sexual mode of transmission: Secondary | ICD-10-CM | POA: Diagnosis present

## 2019-12-18 DIAGNOSIS — G8929 Other chronic pain: Secondary | ICD-10-CM

## 2019-12-18 DIAGNOSIS — M25561 Pain in right knee: Secondary | ICD-10-CM

## 2019-12-18 DIAGNOSIS — Z124 Encounter for screening for malignant neoplasm of cervix: Secondary | ICD-10-CM | POA: Diagnosis present

## 2019-12-18 DIAGNOSIS — Z01419 Encounter for gynecological examination (general) (routine) without abnormal findings: Secondary | ICD-10-CM | POA: Diagnosis not present

## 2019-12-18 DIAGNOSIS — R600 Localized edema: Secondary | ICD-10-CM

## 2019-12-18 MED ORDER — NORETHIN ACE-ETH ESTRAD-FE 1-20 MG-MCG PO TABS: 1.0000 | ORAL_TABLET | Freq: Every day | ORAL | 4 refills | Status: DC

## 2019-12-18 NOTE — Patient Instructions (Signed)
[facility] [address] [place],  [place]  [address] Get Driving Directions Main: [phone]  Fax: [phone] 

## 2019-12-18 NOTE — Telephone Encounter (Signed)
Medication refill request: Aurovela Fe 1-20 Last AEX:  08/29/2018 Next AEX: 12/18/2019 Last MMG (if hormonal medication request): N/A Refill authorized: Patient coming in for annual exam today. Will decline until she is evaluated at aex.

## 2019-12-19 LAB — COMPREHENSIVE METABOLIC PANEL
ALT: 16 IU/L (ref 0–32)
AST: 16 IU/L (ref 0–40)
Albumin/Globulin Ratio: 1.5 (ref 1.2–2.2)
Albumin: 4.1 g/dL (ref 3.8–4.8)
Alkaline Phosphatase: 50 IU/L (ref 48–121)
BUN/Creatinine Ratio: 14 (ref 9–23)
BUN: 11 mg/dL (ref 6–20)
Bilirubin Total: 0.3 mg/dL (ref 0.0–1.2)
CO2: 23 mmol/L (ref 20–29)
Calcium: 9 mg/dL (ref 8.7–10.2)
Chloride: 106 mmol/L (ref 96–106)
Creatinine, Ser: 0.8 mg/dL (ref 0.57–1.00)
GFR calc Af Amer: 107 mL/min/{1.73_m2} (ref >59)
GFR calc non Af Amer: 93 mL/min/{1.73_m2} (ref >59)
Globulin, Total: 2.8 g/dL (ref 1.5–4.5)
Glucose: 88 mg/dL (ref 65–99)
Potassium: 4.2 mmol/L (ref 3.5–5.2)
Sodium: 143 mmol/L (ref 134–144)
Total Protein: 6.9 g/dL (ref 6.0–8.5)

## 2019-12-19 LAB — LIPID PANEL
Chol/HDL Ratio: 3.8 ratio (ref 0.0–4.4)
Cholesterol, Total: 227 mg/dL — ABNORMAL HIGH (ref 100–199)
HDL: 59 mg/dL (ref >39)
LDL Chol Calc (NIH): 158 mg/dL — ABNORMAL HIGH (ref 0–99)
Triglycerides: 58 mg/dL (ref 0–149)
VLDL Cholesterol Cal: 10 mg/dL (ref 5–40)

## 2019-12-19 LAB — CBC
Hematocrit: 39.2 % (ref 34.0–46.6)
Hemoglobin: 13.2 g/dL (ref 11.1–15.9)
MCH: 30.3 pg (ref 26.6–33.0)
MCHC: 33.7 g/dL (ref 31.5–35.7)
MCV: 90 fL (ref 79–97)
Platelets: 181 10*3/uL (ref 150–450)
RBC: 4.36 x10E6/uL (ref 3.77–5.28)
RDW: 13.1 % (ref 11.7–15.4)
WBC: 4.6 10*3/uL (ref 3.4–10.8)

## 2019-12-19 LAB — RPR: RPR Ser Ql: NONREACTIVE

## 2019-12-19 LAB — HIV ANTIBODY (ROUTINE TESTING W REFLEX): HIV Screen 4th Generation wRfx: NONREACTIVE

## 2019-12-19 LAB — HEPATITIS C ANTIBODY: Hep C Virus Ab: <0.1 s/co ratio (ref 0.0–0.9)

## 2019-12-20 ENCOUNTER — Encounter: Payer: Self-pay | Admitting: Obstetrics & Gynecology

## 2019-12-21 LAB — CYTOLOGY - PAP
Chlamydia: NEGATIVE
Comment: NEGATIVE
Comment: NEGATIVE
Comment: NORMAL
Diagnosis: NEGATIVE
Neisseria Gonorrhea: NEGATIVE
Trichomonas: NEGATIVE

## 2019-12-23 ENCOUNTER — Encounter: Payer: Self-pay | Admitting: Orthopedic Surgery

## 2019-12-23 ENCOUNTER — Ambulatory Visit: Payer: 59 | Admitting: Orthopedic Surgery

## 2019-12-23 ENCOUNTER — Ambulatory Visit: Payer: Self-pay

## 2019-12-23 DIAGNOSIS — M25461 Effusion, right knee: Secondary | ICD-10-CM | POA: Diagnosis not present

## 2019-12-23 DIAGNOSIS — M25561 Pain in right knee: Secondary | ICD-10-CM

## 2019-12-25 ENCOUNTER — Telehealth: Payer: Self-pay | Admitting: Orthopedic Surgery

## 2019-12-25 NOTE — Telephone Encounter (Signed)
Pls advise.  

## 2019-12-25 NOTE — Telephone Encounter (Signed)
Patient called.   She was calling to let us know that her knee is still swelling and sore. Wants a call back to discuss her next step  Call back: [phone]

## 2019-12-26 DIAGNOSIS — M25461 Effusion, right knee: Secondary | ICD-10-CM | POA: Diagnosis not present

## 2019-12-26 MED ORDER — BUPIVACAINE HCL 0.25 % IJ SOLN
4.0000 mL | INTRAMUSCULAR | Status: AC | PRN
Start: 2019-12-26 — End: 2019-12-26
  Administered 2019-12-26: 4 mL via INTRA_ARTICULAR

## 2019-12-26 MED ORDER — METHYLPREDNISOLONE ACETATE 40 MG/ML IJ SUSP
40.0000 mg | INTRAMUSCULAR | Status: AC | PRN
  Administered 2019-12-26: 40 mg via INTRA_ARTICULAR

## 2019-12-26 MED ORDER — LIDOCAINE HCL 1 % IJ SOLN
5.0000 mL | INTRAMUSCULAR | Status: AC | PRN
  Administered 2019-12-26: 5 mL
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
## 2019-12-27 ENCOUNTER — Other Ambulatory Visit: Payer: 59

## 2019-12-28 NOTE — Telephone Encounter (Signed)
MRI scan pending.  Okay for range of motion as tolerated but if it is that painful may need to diminish weightbearing.  Okay for over-the-counter anti-inflammatories taken on a fairly routine basis for the next 2 weeks until the scan is performed.  Please call thanks alternatively we could call her in some Mobic 15 mg a day for the next 2 weeks.

## 2019-12-29 NOTE — Telephone Encounter (Signed)
Tried calling patient. No answer. LMVM advising per note below.

## 2019-12-31 ENCOUNTER — Telehealth: Payer: Self-pay | Admitting: Orthopedic Surgery

## 2019-12-31 MED ORDER — MELOXICAM 15 MG PO TABS
15.0000 mg | ORAL_TABLET | Freq: Every day | ORAL | 0 refills | Status: DC
Start: 2019-12-31 — End: 2020-01-28

## 2019-12-31 NOTE — Telephone Encounter (Signed)
submitted

## 2019-12-31 NOTE — Telephone Encounter (Signed)
Patient called advised she uses the [facility] on [address] in [facility] [address]. Patient said ok to send Rx Mobic 15mg  to pharmacy The number to contact patient is [phone]

## 2020-01-19 ENCOUNTER — Telehealth: Payer: Self-pay

## 2020-01-19 NOTE — Telephone Encounter (Signed)
Patients MRI was denied. I talked with Dr August Saucer. He would like for her to begin HEP and follow up with him in 6 weeks for repeat eval and we will try to get auth at that time.

## 2020-01-28 ENCOUNTER — Other Ambulatory Visit: Payer: Self-pay | Admitting: Orthopedic Surgery

## 2020-01-28 NOTE — Telephone Encounter (Signed)
Pls advise.  

## 2020-02-25 ENCOUNTER — Ambulatory Visit: Payer: 59 | Admitting: Orthopedic Surgery

## 2020-02-25 DIAGNOSIS — M25561 Pain in right knee: Secondary | ICD-10-CM | POA: Diagnosis not present

## 2020-02-25 DIAGNOSIS — M25461 Effusion, right knee: Secondary | ICD-10-CM

## 2020-02-26 ENCOUNTER — Telehealth: Payer: Self-pay

## 2020-02-26 NOTE — Telephone Encounter (Signed)
Can we get her in sooner anywhere?

## 2020-02-26 NOTE — Telephone Encounter (Signed)
Pt was going to be scheduled at [facility] this weekend but I went in to try to get her authorized with insurance and was denied again and needs clinicals faxed, No ov notes were done from yesterday, please get notes done so can get her scehduled somewhere sooner if she gets approved.

## 2020-02-26 NOTE — Telephone Encounter (Signed)
Patient called her MRI is scheduled for 10/5 she stated it was supposed to be made sooner she wanted to let Dr.Dean know. Call [phone]

## 2020-02-26 NOTE — Telephone Encounter (Signed)
See below. Need dictation ASAP so we can get patient scheduled.

## 2020-02-28 ENCOUNTER — Encounter: Payer: Self-pay | Admitting: Orthopedic Surgery
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
## 2020-02-28 NOTE — Telephone Encounter (Signed)
Dictation in epic now

## 2020-02-29 ENCOUNTER — Encounter: Payer: Self-pay | Admitting: Orthopedic Surgery

## 2020-02-29 NOTE — Telephone Encounter (Signed)
Dictation complete 

## 2020-03-02 ENCOUNTER — Ambulatory Visit: Payer: 59 | Admitting: Orthopedic Surgery

## 2020-03-02 NOTE — Telephone Encounter (Signed)
Submitted clinicals to [facility], case # [id number]

## 2020-03-13 ENCOUNTER — Other Ambulatory Visit: Payer: Self-pay | Admitting: Surgical

## 2020-03-14 NOTE — Telephone Encounter (Signed)
Pls advise.  

## 2020-03-15 ENCOUNTER — Ambulatory Visit
Admission: RE | Admit: 2020-03-15 | Discharge: 2020-03-15 | Disposition: A | Discharge status: Home / Self Care | Payer: 59 | Source: Ambulatory Visit | Attending: Orthopedic Surgery | Admitting: Orthopedic Surgery

## 2020-03-15 ENCOUNTER — Other Ambulatory Visit: Payer: Self-pay

## 2020-03-15 DIAGNOSIS — M25561 Pain in right knee: Secondary | ICD-10-CM

## 2020-03-17 ENCOUNTER — Telehealth: Payer: Self-pay | Admitting: Orthopedic Surgery

## 2020-03-17 NOTE — Telephone Encounter (Signed)
Pls advise. Thanks.  

## 2020-03-17 NOTE — Telephone Encounter (Signed)
Pt called wanting to know if Dr.Dean would be willing to give her , her MRI results over the phone? If so pt would like a CB at   [phone]

## 2020-03-18 NOTE — Telephone Encounter (Signed)
I called Julia Schultz.  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  Discussed management strategies with her including keeping the quad muscle strong getting close to the ideal body weight episodic anti-inflammatories episodic injections and potentially she will need partial versus complete knee replacement hopefully when she is after 39 years old.  Patient understood all questions answered she will follow-up as needed.

## 2020-03-25 ENCOUNTER — Telehealth: Payer: Self-pay | Admitting: Orthopedic Surgery

## 2020-03-25 NOTE — Telephone Encounter (Signed)
Patient called asked if she still need to keep the Appointment 03/30/2020 The number to contact patient is [phone]

## 2020-03-25 NOTE — Telephone Encounter (Signed)
Called pt and advised her of Dr. Alfonso Patten last message. She decided to cancel her appointment. Appointment was cancelled

## 2020-03-30 ENCOUNTER — Ambulatory Visit: Payer: 59 | Admitting: Orthopedic Surgery

## 2020-04-16 ENCOUNTER — Other Ambulatory Visit: Payer: Self-pay | Admitting: Surgical

## 2020-04-18 NOTE — Telephone Encounter (Signed)
Please advise. Thanks.  

## 2020-04-26 ENCOUNTER — Telehealth (INDEPENDENT_AMBULATORY_CARE_PROVIDER_SITE_OTHER): Payer: 59 | Admitting: Family Medicine

## 2020-04-26 ENCOUNTER — Other Ambulatory Visit: Payer: Self-pay

## 2020-04-26 DIAGNOSIS — R062 Wheezing: Secondary | ICD-10-CM | POA: Diagnosis not present

## 2020-04-26 DIAGNOSIS — R519 Headache, unspecified: Secondary | ICD-10-CM

## 2020-04-26 DIAGNOSIS — R0981 Nasal congestion: Secondary | ICD-10-CM | POA: Diagnosis not present

## 2020-04-26 MED ORDER — FLUCONAZOLE 100 MG PO TABS: 100.0000 mg | ORAL_TABLET | Freq: Every day | ORAL | 0 refills | Status: DC

## 2020-04-26 MED ORDER — DOXYCYCLINE HYCLATE 100 MG PO TABS
100.0000 mg | ORAL_TABLET | Freq: Two times a day (BID) | ORAL | 0 refills | Status: DC
Start: 2020-04-26 — End: 2020-05-09

## 2020-04-26 MED ORDER — ALBUTEROL SULFATE HFA 108 (90 BASE) MCG/ACT IN AERS: 2.0000 | INHALATION_SPRAY | Freq: Four times a day (QID) | RESPIRATORY_TRACT | 0 refills | Status: AC | PRN

## 2020-04-26 NOTE — Progress Notes (Signed)
Virtual Visit via Video Note  I connected with Julia Schultz  on 04/26/20 at  3:20 PM EST by a video enabled telemedicine application and verified that I am speaking with the correct person using two identifiers.  Location patient: home, [place] Location provider:work or home office Persons participating in the virtual visit: patient, provider  I discussed the limitations of evaluation and management by telemedicine and the availability of in person appointments. The patient expressed understanding and agreed to proceed.   HPI:  Acute telemedicine visit for sinus issues: -Onset: 3 weeks ago -Symptoms include: nasal congestion, eye irritation and drainage initially, eyes and sinuses have been bad every time she is outside, now worse the last 3-4 days with thick sinus congestion and sinus pain R maxillary and has some ear issues, headache, cough, loss of taste or smell, occ wheeze -Denies:SOB, CP, fevers, NVD, inability to get our of bed/eat/drink -Has tried:coricedin, padaday -Pertinent past medical history: hx of sinusitis, hx of allergies, hx of wheeze when allergies act up or sinus issues -Pertinent medication allergies:no abx allergies -COVID-19 vaccine status: not vaccinated for covid19  ROS: See pertinent positives and negatives per HPI.  Past Medical History:  Diagnosis Date  . 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 Anemia   . Complication of anesthesia    woke as a child during procedure  . GERD (gastroesophageal reflux disease)   . Headache    migraines  . Menorrhagia   . Thrombocytopenia (HCC) 2005    Past Surgical History:  Procedure Laterality Date  . BREAST SURGERY  2003   breast reduction   . CESAREAN SECTION     x 3  . CHOLECYSTECTOMY N/A 08/10/2015   Procedure: LAPAROSCOPIC CHOLECYSTECTOMY WITH INTRAOPERATIVE CHOLANGIOGRAM;  Surgeon: Manus Rudd, MD;  Location: [facility] OR;  Service: General;  Laterality: N/A;  . COLPOSCOPY  2018  . FOOT SURGERY Left  child 4th grade   correct arch  . TUBAL LIGATION Bilateral 2010     Current Outpatient Medications:  .  albuterol (PROAIR HFA) 108 (90 Base) MCG/ACT inhaler, Inhale 2 puffs into the lungs every 6 (six) hours as needed for wheezing or shortness of breath., Disp: 1 each, Rfl: 0 .  doxycycline (VIBRA-TABS) 100 MG tablet, Take 1 tablet (100 mg total) by mouth 2 (two) times daily., Disp: 20 tablet, Rfl: 0 .  fluconazole (DIFLUCAN) 100 MG tablet, Take 1 tablet (100 mg total) by mouth daily., Disp: 1 tablet, Rfl: 0 .  meloxicam (MOBIC) 15 MG tablet, TAKE 1 TABLET(15 MG) BY MOUTH DAILY, Disp: 30 tablet, Rfl: 0 .  norethindrone-ethinyl estradiol (LOESTRIN FE) 1-20 MG-MCG tablet, Take 1 tablet by mouth daily., Disp: 84 tablet, Rfl: 4 .  TURMERIC PO, Take by mouth., Disp: , Rfl:   EXAM:  VITALS per patient if applicable:  GENERAL: alert, oriented, appears well and in no acute distress  HEENT: atraumatic, conjunttiva clear, no obvious abnormalities on inspection of external nose and ears  NECK: normal movements of the head and neck  LUNGS: on inspection no signs of respiratory distress, breathing rate appears normal, no obvious gross SOB, gasping or wheezing  CV: no obvious cyanosis  MS: moves all visible extremities without noticeable abnormality  PSYCH/NEURO: pleasant and cooperative, no obvious depression or anxiety, speech and thought processing grossly intact  ASSESSMENT AND PLAN:  Discussed the following assessment and plan:  Sinus congestion  Facial pain  Wheeze  -we discussed possible serious and likely etiologies,  options for evaluation and workup, limitations of telemedicine visit vs in person visit, treatment, treatment risks and precautions. Pt prefers to treat via telemedicine empirically rather than in person at this moment.  Query sinusitis secondary to underlying allergies or viral illness, versus new acute illness, versus other.  Opted given duration of symptoms, now  with worsening to treat with doxycycline 100 mg twice daily for 7 to 10 days.  Sent in albuterol prescription as well, as she reports wheezing with prior respiratory illnesses and occasional wheeze but this is a well.  Discussed how to use properly.  Advised follow-up with primary care doctor to talk about asthma testing.  She also has recurrent yeast vaginitis whenever she uses antibiotics.  Sent Diflucan for this.  Discussed possibility of Covid and advised testing, discussed treatment options, isolation, potential complications and precautions. Work/School slipped offered: provided in patient instructions   Scheduled follow up with PCP offered: She agrees to follow-up as needed. Advised to seek prompt in person care if worsening, new symptoms arise, or if is not improving with treatment. Discussed options for inperson care if PCP office not available. Did let this patient know that I only do telemedicine on Tuesdays and Thursdays for [facility]. Advised to schedule follow up visit with PCP or [facility] if any further questions or concerns to avoid delays in care.   I discussed the assessment and treatment plan with the patient. The patient was provided an opportunity to ask questions and all were answered. The patient agreed with the plan and demonstrated an understanding of the instructions.     Terressa Koyanagi, DO

## 2020-04-26 NOTE — Patient Instructions (Addendum)
   ---------------------------------------------------------------------------------------------------------------------------      WORK SLIP:  Patient Julia Schultz,  12/02/1980, was seen for a medical visit today, 04/26/20 . Please excuse from work according to the [facility] guidelines for a COVID like illness. We advise 10 days minimum from the onset of symptoms (04/22/20) PLUS 1 day of no fever and improved symptoms. Will defer to employer for a sooner return to work if COVID19 testing is negative and the symptoms have resolved. Advise following CDC guidelines.    Sincerely: E-signature: Dr. [name], DO [facility] Ph: [phone]   ------------------------------------------------------------------------------------------------------------------------------       It was nice to meet you today, and I really hope you are feeling better soon. I help [facility] out with telemedicine visits on Tuesdays and Thursdays and am available for visits on those days. If you have any concerns or questions following this visit please schedule a follow up visit with your Primary Care doctor or seek care at a local urgent care clinic to avoid delays in care.    Seek in person care promptly if your symptoms worsen, new concerns arise or you are not improving with treatment. Call 911 and/or seek emergency care if you symptoms are severe or life threatening.   -stay home while sick, and if you have COVID19 please stay home for a full 10 days since the onset of symptoms PLUS one day of no fever and feeling better  -[facility] COVID19 testing information: [email or web address] [place] [phone] You also can get testing at most pharmacies.  -I sent the medication(s) we discussed to your pharmacy: Meds ordered this encounter  Medications  . doxycycline (VIBRA-TABS) 100 MG tablet    Sig: Take 1 tablet (100 mg total) by mouth 2 (two) times daily.     Dispense:  20 tablet    Refill:  0  . fluconazole (DIFLUCAN) 100 MG tablet    Sig: Take 1 tablet (100 mg total) by mouth daily.    Dispense:  1 tablet    Refill:  0  . albuterol (PROAIR HFA) 108 (90 Base) MCG/ACT inhaler    Sig: Inhale 2 puffs into the lungs every 6 (six) hours as needed for wheezing or shortness of breath.    Dispense:  1 each    Refill:  0    -If the covid test is positive and you desire treatment, call the outpatient COVID19 treatment ([facility] infusion) center @ [phone]  -can use tylenol or aleve if needed for fevers, aches and pains per instructions  -can use nasal saline a few times per day if nasal congestion, sometime a short course of Afrin nasal spray for 3 days can help as well  -stay hydrated, drink plenty of fluids and eat small healthy meals - avoid dairy  -can take 1000 IU Vit D3 and Vit C lozenges per instructions  -follow up with your doctor in 2-3 days unless improving and feeling better

## 2020-04-29 ENCOUNTER — Telehealth: Payer: Self-pay

## 2020-04-29 MED ORDER — PREDNISONE 20 MG PO TABS: 20.0000 mg | ORAL_TABLET | Freq: Two times a day (BID) | ORAL | 0 refills | Status: DC

## 2020-04-29 NOTE — Telephone Encounter (Signed)
Spoke to pt told her will send in Prednisone 20 mg BID x 5 days to help with itching and congestion. Told her can also take Delsym cough syrup OTC. Pt verbalized understanding. Told pt if symptoms worsen please go to Urgent care over the weekend. Pt verbalized understanding. R

## 2020-04-29 NOTE — Telephone Encounter (Signed)
Patient seen dr. Selena Batten 11/16 and is taking antibiotics and is not improving. Patient had a covid test resulting negative yesterday 11/18 patient would like to know if we can get her more or different medication or what she can do now? Please advise

## 2020-04-29 NOTE — Telephone Encounter (Signed)
Rx sent to the pharmacy.

## 2020-04-29 NOTE — Telephone Encounter (Signed)
Called patient, patient stated still with Ear aches, Itching eyes, unable to breath due to stuffy nose  Taking antibiotic, Rx doxycycline x 4days  No changes since taking medication.

## 2020-04-29 NOTE — Telephone Encounter (Signed)
Please see message and advise 

## 2020-04-29 NOTE — Telephone Encounter (Signed)
I would recommend continuing antibiotic. May add oral prednisone (20 mg BID x 5 days) to see if this helps with itching and congestion.

## 2020-04-29 NOTE — Telephone Encounter (Signed)
What symptoms is she still having? Any new symptoms?

## 2020-05-03 ENCOUNTER — Ambulatory Visit: Payer: 59 | Admitting: Physician Assistant

## 2020-05-03 ENCOUNTER — Other Ambulatory Visit: Payer: Self-pay

## 2020-05-03 ENCOUNTER — Encounter: Payer: Self-pay | Admitting: Physician Assistant

## 2020-05-03 VITALS — BP 110/80 | HR 60 | Temp 97.7°F | Ht 63.75 in | Wt 244.4 lb

## 2020-05-03 DIAGNOSIS — J0100 Acute maxillary sinusitis, unspecified: Secondary | ICD-10-CM | POA: Diagnosis not present

## 2020-05-03 MED ORDER — PREDNISONE 20 MG PO TABS: 20.0000 mg | ORAL_TABLET | Freq: Two times a day (BID) | ORAL | 0 refills | Status: DC

## 2020-05-03 MED ORDER — LEVOFLOXACIN 500 MG PO TABS: 500.0000 mg | ORAL_TABLET | Freq: Every day | ORAL | 0 refills | Status: DC

## 2020-05-03 MED ORDER — FLUCONAZOLE 150 MG PO TABS: 150.0000 mg | ORAL_TABLET | ORAL | 0 refills | Status: DC

## 2020-05-03 NOTE — Progress Notes (Signed)
Julia Schultz is a 39 y.o. female here for a new problem.  I acted as a Neurosurgeon for [employer], PA-C Corky Mull, LPN   History of Present Illness:   Chief Complaint  Patient presents with  . Otalgia    HPI   Ear pain Pt still c/o bilateral ear pain, facial pain and headaches. Pt had a virtual visit with [name] was prescribed doxycycline and albuterol on 04/26/20. She continues to have thick sinus drainage and pressure. She called to our office on 04/29/20 and asked if we could provide any further recommendations, and I sent in oral prednisone. She is still taking these medications but having ongoing symptoms. No symptoms have worsened. Still having facial pain, nasal congestion, hoarse voice. Pt has been using cough drops and tea. She had Negative self-swab COVID test on 11/18.  She is unvaccinated against COVID.   Past Medical History:  Diagnosis Date  . 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 Anemia   . Complication of anesthesia    woke as a child during procedure  . GERD (gastroesophageal reflux disease)   . Headache    migraines  . Menorrhagia   . Thrombocytopenia (HCC) 2005     Social History   Tobacco Use  . Smoking status: Never Smoker  . Smokeless tobacco: Never Used  Vaping Use  . Vaping Use: Never used  Substance Use Topics  . Alcohol use: Yes    Comment: occ  . Drug use: No    Past Surgical History:  Procedure Laterality Date  . BREAST SURGERY  2003   breast reduction   . CESAREAN SECTION     x 3  . CHOLECYSTECTOMY N/A 08/10/2015   Procedure: LAPAROSCOPIC CHOLECYSTECTOMY WITH INTRAOPERATIVE CHOLANGIOGRAM;  Surgeon: Manus Rudd, MD;  Location: [facility] OR;  Service: General;  Laterality: N/A;  . COLPOSCOPY  2018  . FOOT SURGERY Left child 4th grade   correct arch  . TUBAL LIGATION Bilateral 2010    Family History  Problem Relation Age of Onset  . Hyperlipidemia Mother   . Hypertension Mother   . Diabetes Mother   .  Diverticulitis Mother   . Pancreatic cancer Father     Allergies  Allergen Reactions  . Banana Anaphylaxis    "lump in throat"  . Latex Itching  . Other     Walnuts cause itchy throat  . Tomato Hives and Swelling    Current Medications:   Current Outpatient Medications:  .  albuterol (PROAIR HFA) 108 (90 Base) MCG/ACT inhaler, Inhale 2 puffs into the lungs every 6 (six) hours as needed for wheezing or shortness of breath., Disp: 1 each, Rfl: 0 .  doxycycline (VIBRA-TABS) 100 MG tablet, Take 1 tablet (100 mg total) by mouth 2 (two) times daily., Disp: 20 tablet, Rfl: 0 .  meloxicam (MOBIC) 15 MG tablet, TAKE 1 TABLET(15 MG) BY MOUTH DAILY, Disp: 30 tablet, Rfl: 0 .  norethindrone-ethinyl estradiol (LOESTRIN FE) 1-20 MG-MCG tablet, Take 1 tablet by mouth daily., Disp: 84 tablet, Rfl: 4 .  predniSONE (DELTASONE) 20 MG tablet, Take 1 tablet (20 mg total) by mouth 2 (two) times daily with a meal., Disp: 10 tablet, Rfl: 0 .  TURMERIC PO, Take by mouth., Disp: , Rfl:  .  fluconazole (DIFLUCAN) 150 MG tablet, Take 1 tablet (150 mg total) by mouth once a week., Disp: 3 tablet, Rfl: 0 .  levofloxacin (LEVAQUIN) 500 MG tablet, Take 1 tablet (  500 mg total) by mouth daily., Disp: 5 tablet, Rfl: 0   Review of Systems:   ROS  Negative unless otherwise specified per HPI.  Vitals:   Vitals:   05/03/20 1536  BP: 110/80  Pulse: 60  Temp: 97.7 F (36.5 C)  TempSrc: Temporal  SpO2: 100%  Weight: 244 lb 6.1 oz (110.9 kg)  Height: 5' 3.75" (1.619 m)     Body mass index is 42.28 kg/m.  Physical Exam:   Physical Exam Vitals and nursing note reviewed.  Constitutional:      General: She is not in acute distress.    Appearance: She is well-developed. She is not ill-appearing or toxic-appearing.  HENT:     Head: Normocephalic and atraumatic.     Right Ear: Ear canal and external ear normal. There is impacted cerumen.     Left Ear: Ear canal and external ear normal. A middle ear effusion  (light yellow) is present. Tympanic membrane is not erythematous, retracted or bulging.     Nose:     Right Sinus: Maxillary sinus tenderness present. No frontal sinus tenderness.     Left Sinus: Maxillary sinus tenderness present. No frontal sinus tenderness.     Mouth/Throat:     Pharynx: Uvula midline. No posterior oropharyngeal erythema.     Tonsils: 1+ on the right. 1+ on the left.  Eyes:     General: Lids are normal.     Conjunctiva/sclera: Conjunctivae normal.  Neck:     Trachea: Trachea normal.  Cardiovascular:     Rate and Rhythm: Normal rate and regular rhythm.     Pulses: Normal pulses.     Heart sounds: Normal heart sounds, S1 normal and S2 normal.     Comments: No LE edema Pulmonary:     Effort: Pulmonary effort is normal.     Breath sounds: Normal breath sounds. No decreased breath sounds, wheezing, rhonchi or rales.  Lymphadenopathy:     Cervical: No cervical adenopathy.  Skin:    General: Skin is warm and dry.  Neurological:     Mental Status: She is alert.     GCS: GCS eye subscore is 4. GCS verbal subscore is 5. GCS motor subscore is 6.  Psychiatric:        Speech: Speech normal.        Behavior: Behavior normal. Behavior is cooperative.     Assessment and Plan:   Librada was seen today for otalgia.  Diagnoses and all orders for this visit:  Acute non-recurrent maxillary sinusitis No red flags on exam. Lack of improvement despite treatment. Will escalate abx therapy to levaquin 500 mg daily and extend oral prednisone. Oral diflucan sent in per patient request to cover for possible antibiotic-induced yeast infection. Recommend that she reach out to our office if symptoms persist next week, will likely order CT scan of sinuses vs referral to ENT. Worsening precautions over the holiday weekend -- low threshold to seek in person care if new symptoms, such as fever, develop.  Other orders -     levofloxacin (LEVAQUIN) 500 MG tablet; Take 1 tablet (500 mg  total) by mouth daily. -     predniSONE (DELTASONE) 20 MG tablet; Take 1 tablet (20 mg total) by mouth 2 (two) times daily with a meal. -     fluconazole (DIFLUCAN) 150 MG tablet; Take 1 tablet (150 mg total) by mouth once a week.  CMA or LPN served as scribe during this visit. History, Physical, and Plan performed  by medical provider. The above documentation has been reviewed and is accurate and complete.  Inda Coke, PA-C

## 2020-05-03 NOTE — Patient Instructions (Signed)
It was great to see you!  Stop doxycycline. Start daily levaquin antibiotic x 5 days. I have extended your prednisone.  If you are still having significant symptoms next week, we will get CT of sinuses.  Contact a health care provider if:  You have a fever.  Your symptoms get worse.  Your symptoms do not improve. Get help right away if:  You have a severe headache.  You have persistent vomiting.  You worsening, severe pain or swelling around your face or eyes.  You have vision problems.  You develop confusion.  Your neck is stiff.  You have trouble breathing.  Take care,  Jarold Motto PA-C

## 2020-05-09 ENCOUNTER — Other Ambulatory Visit: Payer: Self-pay

## 2020-05-09 ENCOUNTER — Telehealth (INDEPENDENT_AMBULATORY_CARE_PROVIDER_SITE_OTHER): Payer: 59 | Admitting: Internal Medicine

## 2020-05-09 DIAGNOSIS — R0981 Nasal congestion: Secondary | ICD-10-CM

## 2020-05-09 MED ORDER — MONTELUKAST SODIUM 10 MG PO TABS: 10.0000 mg | ORAL_TABLET | Freq: Every day | ORAL | 0 refills | Status: DC

## 2020-05-09 NOTE — Progress Notes (Signed)
Virtual Visit via Video Note  I connected with Julia Schultz on 05/09/20 at  2:40 PM EST by a video enabled telemedicine application and verified that I am speaking with the correct person using two identifiers.  The patient and the provider were at separate locations throughout the entire encounter. Patient location: home, Provider location: work   I discussed the limitations of evaluation and management by telemedicine and the availability of in person appointments. The patient expressed understanding and agreed to proceed. The patient and the provider were the only parties present for the visit unless noted in HPI below.  History of Present Illness: The patient is a 39 y.o. female with visit for fatigue and congestion. Started about a month ago or more. Had negative covid-19 04/28/20. Not vaccinated for covid-19. Has done doxycycline and prednisone and then changed to levaquin. She has finished levaquin and prednisone. Feels that that helped some but still pressure in the sinuses. Fatigue and minimal SOB. Denies productive cough. Is not taking any allergy medication throughout this and does not think it was recommended. Overall it is improving but not gone.   Observations/Objective: Appearance: normal, breathing appears normal, mild cough during visit, casual grooming, abdomen does not appear distended, throat not well visualized, some pressure to frontal sinuses self-palpation, memory normal, mental status is A and O times 3  Assessment and Plan: See problem oriented charting  Follow Up Instructions: rx singulair to help with allergy congestion  I discussed the assessment and treatment plan with the patient. The patient was provided an opportunity to ask questions and all were answered. The patient agreed with the plan and demonstrated an understanding of the instructions.   The patient was advised to call back or seek an in-person evaluation if the symptoms worsen or if the condition fails to  improve as anticipated.  Myrlene Broker, MD

## 2020-05-10 ENCOUNTER — Encounter: Payer: Self-pay | Admitting: Internal Medicine

## 2020-05-10 ENCOUNTER — Telehealth: Payer: Self-pay

## 2020-05-10 DIAGNOSIS — R0981 Nasal congestion: Secondary | ICD-10-CM | POA: Insufficient documentation

## 2020-05-10 NOTE — Telephone Encounter (Signed)
Please see below.

## 2020-05-10 NOTE — Telephone Encounter (Signed)
Pt is requesting a work note from 11/17 until she finishes her current medication.

## 2020-05-10 NOTE — Assessment & Plan Note (Signed)
Negative covid-19 test throughout this illness, overall improving. Finished prednisone and levaquin with partial course doxycycline. Rx singulair and advised otc allergy medications as well to help with persistent congestion. Cough can linger for weeks after congestion is gone due to mechanism. No further antibiotics or steroids indicated today. Advised to get covid-19 vaccination.

## 2020-05-10 NOTE — Telephone Encounter (Signed)
PCP can address as she has seen for current condition. She is finished with all antibiotics and prednisone at this time and has had negative covid-19 testing and is not contagious.

## 2020-05-11 NOTE — Telephone Encounter (Signed)
Please advise 

## 2020-05-11 NOTE — Telephone Encounter (Signed)
Pt is calling to follow up on these issues.

## 2020-05-11 NOTE — Telephone Encounter (Signed)
Pt called following up on work note. Pt also is asking about medication that was prescribed to her. She was prescribed montelukast and is hesitant to take the medication due to side effects that are listed on the label. Pt states she took Mucinex instead and asked if this was okay. Please advise.

## 2020-05-11 NOTE — Telephone Encounter (Signed)
Okay to not take singulair if she does not want to.  I need more specific information about what she is wanting her work note to say before I can say whether or not I will approve it. Please obtain more information from patient.  Julia Schultz

## 2020-05-12 NOTE — Telephone Encounter (Signed)
Called Julia Schultz and discussed below message. Decided that it was best for Julia Schultz to come in and meet with [name] at 8:30 to discuss concerns and work note.

## 2020-05-13 ENCOUNTER — Ambulatory Visit: Payer: 59 | Admitting: Physician Assistant

## 2020-05-13 ENCOUNTER — Ambulatory Visit (INDEPENDENT_AMBULATORY_CARE_PROVIDER_SITE_OTHER)
Admission: RE | Admit: 2020-05-13 | Discharge: 2020-05-13 | Disposition: A | Discharge status: Home / Self Care | Payer: 59 | Source: Ambulatory Visit | Attending: Physician Assistant | Admitting: Physician Assistant

## 2020-05-13 ENCOUNTER — Encounter: Payer: Self-pay | Admitting: Physician Assistant

## 2020-05-13 ENCOUNTER — Other Ambulatory Visit: Payer: Self-pay

## 2020-05-13 VITALS — BP 114/68 | HR 88 | Temp 97.6°F | Wt 248.2 lb

## 2020-05-13 DIAGNOSIS — R059 Cough, unspecified: Secondary | ICD-10-CM

## 2020-05-13 DIAGNOSIS — E559 Vitamin D deficiency, unspecified: Secondary | ICD-10-CM | POA: Diagnosis not present

## 2020-05-13 DIAGNOSIS — R0981 Nasal congestion: Secondary | ICD-10-CM

## 2020-05-13 DIAGNOSIS — R5383 Other fatigue: Secondary | ICD-10-CM

## 2020-05-13 LAB — POCT URINE PREGNANCY: Preg Test, Ur: NEGATIVE

## 2020-05-13 NOTE — Progress Notes (Signed)
Julia Schultz is a 39 y.o. female here for a follow up of a pre-existing problem.  History of Present Illness:   Chief Complaint  Patient presents with  . sinus congestion    HPI   URI Patient is following up on URI. This is her 4th visit for this:  Symptoms started at end of October. 04/26/20 - virtual visit with [name]. albuterol inhaler, doxycycline and Diflucan 05/03/20 - in-person visit with me, worsening symptoms. Transitioned to levoquin and Diflucan. 05/09/20 - virtual visit with Hillard Danker. Symptoms slowly improving. Was recommended to start singulair.  Using albuterol about pretty rarely. Overall feels improved. Never started the singulair because she felt like the SI possible side effects were too distressing. Does report that she has ongoing fatigue and feeling SOB with activity. Denies: chest pain, dizziness, fever, chills, leg swelling. Appetite fair. Hoping to return to work on Monday, has been out of work since 03/27/20.  Does have hx of Vit D deficiency -- does not take regular supplement.  Past Medical History:  Diagnosis Date  . 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 Anemia   . Complication of anesthesia    woke as a child during procedure  . GERD (gastroesophageal reflux disease)   . Headache    migraines  . Menorrhagia   . Thrombocytopenia (HCC) 2005     Social History   Tobacco Use  . Smoking status: Never Smoker  . Smokeless tobacco: Never Used  Vaping Use  . Vaping Use: Never used  Substance Use Topics  . Alcohol use: Yes    Comment: occ  . Drug use: No    Past Surgical History:  Procedure Laterality Date  . BREAST SURGERY  2003   breast reduction   . CESAREAN SECTION     x 3  . CHOLECYSTECTOMY N/A 08/10/2015   Procedure: LAPAROSCOPIC CHOLECYSTECTOMY WITH INTRAOPERATIVE CHOLANGIOGRAM;  Surgeon: Manus Rudd, MD;  Location: [facility] OR;  Service: General;  Laterality: N/A;  . COLPOSCOPY  2018  . FOOT SURGERY  Left child 4th grade   correct arch  . TUBAL LIGATION Bilateral 2010    Family History  Problem Relation Age of Onset  . Hyperlipidemia Mother   . Hypertension Mother   . Diabetes Mother   . Diverticulitis Mother   . Pancreatic cancer Father     Allergies  Allergen Reactions  . Banana Anaphylaxis    "lump in throat"  . Latex Itching  . Other     Walnuts cause itchy throat  . Tomato Hives and Swelling    Current Medications:   Current Outpatient Medications:  .  albuterol (PROAIR HFA) 108 (90 Base) MCG/ACT inhaler, Inhale 2 puffs into the lungs every 6 (six) hours as needed for wheezing or shortness of breath., Disp: 1 each, Rfl: 0 .  meloxicam (MOBIC) 15 MG tablet, TAKE 1 TABLET(15 MG) BY MOUTH DAILY, Disp: 30 tablet, Rfl: 0 .  montelukast (SINGULAIR) 10 MG tablet, Take 1 tablet (10 mg total) by mouth at bedtime., Disp: 30 tablet, Rfl: 0 .  norethindrone-ethinyl estradiol (LOESTRIN FE) 1-20 MG-MCG tablet, Take 1 tablet by mouth daily., Disp: 84 tablet, Rfl: 4 .  TURMERIC PO, Take by mouth., Disp: , Rfl:  .  predniSONE (DELTASONE) 20 MG tablet, Take 1 tablet (20 mg total) by mouth 2 (two) times daily with a meal. (Patient not taking: Reported on 05/13/2020), Disp: 10 tablet, Rfl: 0   Review of Systems:  ROS  Negative unless otherwise specified per HPI.  Vitals:   Vitals:   05/13/20 0838  BP: 114/68  Pulse: 88  Temp: 97.6 F (36.4 C)  TempSrc: Temporal  SpO2: 98%  Weight: 248 lb 3.2 oz (112.6 kg)     Body mass index is 42.94 kg/m.  Physical Exam:   Physical Exam Vitals and nursing note reviewed.  Constitutional:      General: She is not in acute distress.    Appearance: She is well-developed. She is not ill-appearing or toxic-appearing.  Cardiovascular:     Rate and Rhythm: Normal rate and regular rhythm.     Pulses: Normal pulses.     Heart sounds: Normal heart sounds, S1 normal and S2 normal.     Comments: No LE edema Pulmonary:     Effort: Pulmonary  effort is normal.     Breath sounds: Normal breath sounds.  Skin:    General: Skin is warm and dry.  Neurological:     Mental Status: She is alert.     GCS: GCS eye subscore is 4. GCS verbal subscore is 5. GCS motor subscore is 6.  Psychiatric:        Speech: Speech normal.        Behavior: Behavior normal. Behavior is cooperative.    Results for orders placed or performed in visit on 05/13/20  POCT urine pregnancy  Result Value Ref Range   Preg Test, Ur Negative Negative     Assessment and Plan:   Kian was seen today for sinus congestion.  Diagnoses and all orders for this visit:  Sinus congestion; Cough; Fatigue, unspecified type Improving congestion and cough but is having lingering SOB and fatigue. Will update blood work and obtain chest xray. No new medications at this time. If symptoms persist, will further work-up vs obtain referral. Worsening precautions advised. No red flags on exam/history. -     POCT urine pregnancy -     CBC with Differential/Platelet; Future -     Comprehensive metabolic panel; Future -     DG Chest 2 View; Future -     Comprehensive metabolic panel -     CBC with Differential/Platelet  Vitamin D deficiency Update Vit D level today and provide recommendations accordingly. -     VITAMIN D 25 Hydroxy (Vit-D Deficiency, Fractures); Future -     VITAMIN D 25 Hydroxy (Vit-D Deficiency, Fractures) -     DG Chest 2 View; Future  Jarold Motto, PA-C

## 2020-05-13 NOTE — Patient Instructions (Addendum)
It was great to see you!  Work note provided.  Update blood work today to assess fatigue.  An order for an xray has been put in for you. To get your xray, you can walk in at the [facility] location without a scheduled appointment.  The address is [address] It is across the street from [facility]. X-ray is located in the basement.  Hours of operation are M-F 8:30am to 5:00pm. Please note that they are closed for lunch between 12:30 and 1:00pm.  Take care,  Jarold Motto PA-C

## 2020-05-14 LAB — CBC WITH DIFFERENTIAL/PLATELET
Absolute Monocytes: 593 cells/uL (ref 200–950)
Basophils Absolute: 42 cells/uL (ref 0–200)
Basophils Relative: 0.4 %
Eosinophils Absolute: 270 cells/uL (ref 15–500)
Eosinophils Relative: 2.6 %
HCT: 38.7 % (ref 35.0–45.0)
Hemoglobin: 12.9 g/dL (ref 11.7–15.5)
Lymphs Abs: 2486 cells/uL (ref 850–3900)
MCH: 30.6 pg (ref 27.0–33.0)
MCHC: 33.3 g/dL (ref 32.0–36.0)
MCV: 91.7 fL (ref 80.0–100.0)
MPV: 11.7 fL (ref 7.5–12.5)
Monocytes Relative: 5.7 %
Neutro Abs: 7010 cells/uL (ref 1500–7800)
Neutrophils Relative %: 67.4 %
Platelets: 138 10*3/uL — ABNORMAL LOW (ref 140–400)
RBC: 4.22 10*6/uL (ref 3.80–5.10)
RDW: 13.1 % (ref 11.0–15.0)
Total Lymphocyte: 23.9 %
WBC: 10.4 10*3/uL (ref 3.8–10.8)

## 2020-05-14 LAB — COMPREHENSIVE METABOLIC PANEL
AG Ratio: 1.5 (calc) (ref 1.0–2.5)
ALT: 19 U/L (ref 6–29)
AST: 12 U/L (ref 10–30)
Albumin: 3.8 g/dL (ref 3.6–5.1)
Alkaline phosphatase (APISO): 38 U/L (ref 31–125)
BUN: 16 mg/dL (ref 7–25)
CO2: 29 mmol/L (ref 20–32)
Calcium: 8.5 mg/dL — ABNORMAL LOW (ref 8.6–10.2)
Chloride: 102 mmol/L (ref 98–110)
Creat: 0.9 mg/dL (ref 0.50–1.10)
Globulin: 2.5 g/dL (calc) (ref 1.9–3.7)
Glucose, Bld: 106 mg/dL — ABNORMAL HIGH (ref 65–99)
Potassium: 4.2 mmol/L (ref 3.5–5.3)
Sodium: 137 mmol/L (ref 135–146)
Total Bilirubin: 0.4 mg/dL (ref 0.2–1.2)
Total Protein: 6.3 g/dL (ref 6.1–8.1)

## 2020-05-14 LAB — VITAMIN D 25 HYDROXY (VIT D DEFICIENCY, FRACTURES): Vit D, 25-Hydroxy: 23 ng/mL — ABNORMAL LOW (ref 30–100)

## 2020-05-16 ENCOUNTER — Telehealth: Payer: Self-pay

## 2020-05-16 NOTE — Telephone Encounter (Signed)
Pt wants someone to call her about her labs.  She is also wanting to extend her work note to cover her today. Today was a 14 hour work day and she did not feel she could do that. She is going to go back to work tomorrow.

## 2020-05-17 NOTE — Telephone Encounter (Signed)
Pt returning call

## 2020-05-17 NOTE — Telephone Encounter (Signed)
Left message on voicemail to call office.  

## 2020-05-17 NOTE — Telephone Encounter (Signed)
Spoke to pt asked her what date she need to be changed to? Pt said out till 12/4 went back to work 12/5. Told pt okay will update letter and it will be in My Chart. Pt verbalized understanding and asked about labs. Told pt per Lelon Mast, Vitamin D levels are low, I recommend an over the counter supplement of 2000 IU daily. Other labs are all essentially normal. Pt verbalized understanding.

## 2020-05-19 ENCOUNTER — Telehealth: Payer: Self-pay

## 2020-05-19 DIAGNOSIS — H9202 Otalgia, left ear: Secondary | ICD-10-CM

## 2020-05-19 NOTE — Telephone Encounter (Signed)
Spoke to pt told her Lelon Mast is out of the office till morning. Told her she can go to an Urgent care to be evaluated or wait till morning and I will call you back to let you know if she wants to see you or what to do. Pt verbalized understanding and said she will wait for [facility]. Asked pt if she was having any blurred vision or if she has taken anything for the pain an pressure? Pt said no blurred vision and has not taken anything. Told pt to try some Tylenol or Ibuprofen to see if that helps. I will talk to you tomorrow. Pt verbalized understanding.

## 2020-05-19 NOTE — Telephone Encounter (Signed)
Pt is still feeling unwell. She states symptoms had started to subside, but now they are starting again. She is feeling pain in pressure in her left ear, left side of her forehead, and left eye. Pt states she can't miss anymore work and doesn't know what to do. She just wants to feel better. Told pt I would relay msg and would get back to her when Lelon Mast gets back.

## 2020-05-20 NOTE — Telephone Encounter (Signed)
Spoke with the patient and she was agreeable to the urgent referral being placed for ENT. She stated that it is ok to leave a voicemail in regards to this matter due to her being at work. Referral has been placed.

## 2020-05-20 NOTE — Addendum Note (Signed)
Addended by: Manuela Schwartz on: 05/20/2020 08:52 AM   Modules accepted: Orders

## 2020-05-20 NOTE — Telephone Encounter (Signed)
At this point I would like to put in an urgent referral for her to see and Ear Nose and Throat specialist since her symptoms have significantly worsened.

## 2020-05-20 NOTE — Telephone Encounter (Signed)
Unable to get in contact with the patient. LVM asking her to call our office back to discuss a urgent referral being placed. Office number was provided.

## 2020-05-21 NOTE — Telephone Encounter (Signed)
This referral came through as routine for me so I sent it to Dr. Ezzard Standing without calling ahead and making them aware it was urgent-I will contact their office on Monday to give them this update

## 2020-05-23 ENCOUNTER — Other Ambulatory Visit: Payer: Self-pay | Admitting: Surgical

## 2020-05-23 ENCOUNTER — Other Ambulatory Visit: Payer: Self-pay

## 2020-05-23 DIAGNOSIS — H9202 Otalgia, left ear: Secondary | ICD-10-CM

## 2020-05-23 NOTE — Telephone Encounter (Signed)
Please advise. Thanks.  

## 2020-05-27 ENCOUNTER — Other Ambulatory Visit: Payer: Self-pay | Admitting: Obstetrics & Gynecology

## 2020-05-27 DIAGNOSIS — N912 Amenorrhea, unspecified: Secondary | ICD-10-CM

## 2020-05-31 ENCOUNTER — Other Ambulatory Visit: Payer: Self-pay

## 2020-05-31 ENCOUNTER — Encounter (INDEPENDENT_AMBULATORY_CARE_PROVIDER_SITE_OTHER): Payer: Self-pay | Admitting: Otolaryngology

## 2020-05-31 ENCOUNTER — Ambulatory Visit (INDEPENDENT_AMBULATORY_CARE_PROVIDER_SITE_OTHER): Payer: 59 | Admitting: Otolaryngology

## 2020-05-31 VITALS — Temp 96.8°F

## 2020-05-31 DIAGNOSIS — Z8709 Personal history of other diseases of the respiratory system: Secondary | ICD-10-CM

## 2020-05-31 DIAGNOSIS — H6121 Impacted cerumen, right ear: Secondary | ICD-10-CM

## 2020-05-31 DIAGNOSIS — J310 Chronic rhinitis: Secondary | ICD-10-CM

## 2020-05-31 NOTE — Progress Notes (Addendum)
HPI: Julia Schultz is a 39 y.o. female who presents is referred by her PCP for evaluation of chronic sinus issues.  Patient states that she gets 1 or 2 sinus infections every year.  However she has been having chronic problems over the past 2 months.  She has been treated with 2 rounds of antibiotics as well as prednisone.  She was first treated with doxycycline back in November and more recently was treated with a course of Levaquin.  She does have history of asthma and allergies but has not seen an allergist yet.  Her main complaint today is mostly thick mucus discharge.  She has been taking Mucinex.  She describes some pressure between her eyes.  She also feels like the right ear is clogged and pops occasionally.  She has had no x-rays of her sinuses.. She had previously had some colored mucus discharge from her nose but it is much better and clear today.  She occasionally loses her voice but her voice is normal today.  Past Medical History:  Diagnosis Date  . 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 Anemia   . Complication of anesthesia    woke as a child during procedure  . GERD (gastroesophageal reflux disease)   . Headache    migraines  . Menorrhagia   . Thrombocytopenia (HCC) 2005   Past Surgical History:  Procedure Laterality Date  . BREAST SURGERY  2003   breast reduction   . CESAREAN SECTION     x 3  . CHOLECYSTECTOMY N/A 08/10/2015   Procedure: LAPAROSCOPIC CHOLECYSTECTOMY WITH INTRAOPERATIVE CHOLANGIOGRAM;  Surgeon: Manus Rudd, MD;  Location: [facility] OR;  Service: General;  Laterality: N/A;  . COLPOSCOPY  2018  . FOOT SURGERY Left child 4th grade   correct arch  . TUBAL LIGATION Bilateral 2010   Social History   Socioeconomic History  . Marital status: Legally Separated    Spouse name: Not on file  . Number of children: Not on file  . Years of education: Not on file  . Highest education level: Not on file  Occupational History  . Not on file  Tobacco  Use  . Smoking status: Never Smoker  . Smokeless tobacco: Never Used  Vaping Use  . Vaping Use: Never used  Substance and Sexual Activity  . Alcohol use: Yes    Comment: occ  . Drug use: No  . Sexual activity: Yes    Partners: Male    Birth control/protection: Surgical    Comment: BTL  Other Topics Concern  . Not on file  Social History Narrative  . Not on file   Social Determinants of Health   Financial Resource Strain: Not on file  Food Insecurity: Not on file  Transportation Needs: Not on file  Physical Activity: Not on file  Stress: Not on file  Social Connections: Not on file   Family History  Problem Relation Age of Onset  . Hyperlipidemia Mother   . Hypertension Mother   . Diabetes Mother   . Diverticulitis Mother   . Pancreatic cancer Father    Allergies  Allergen Reactions  . Banana Anaphylaxis    "lump in throat"  . Latex Itching  . Other     Walnuts cause itchy throat  . Tomato Hives and Swelling   Prior to Admission medications   Medication Sig Start Date End Date Taking? Authorizing Provider  albuterol (PROAIR HFA) 108 (90 Base) MCG/ACT inhaler Inhale 2  puffs into the lungs every 6 (six) hours as needed for wheezing or shortness of breath. 04/26/20   Terressa Koyanagi, DO  meloxicam (MOBIC) 15 MG tablet TAKE 1 TABLET(15 MG) BY MOUTH DAILY 05/23/20   Magnant, Charles L, PA-C  montelukast (SINGULAIR) 10 MG tablet Take 1 tablet (10 mg total) by mouth at bedtime. 05/09/20   Myrlene Broker, MD  norethindrone-ethinyl estradiol (LOESTRIN FE) 1-20 MG-MCG tablet Take 1 tablet by mouth daily. 12/18/19   Jerene Bears, MD  predniSONE (DELTASONE) 20 MG tablet Take 1 tablet (20 mg total) by mouth 2 (two) times daily with a meal. Patient not taking: Reported on 05/13/2020 05/03/20   Jarold Motto, PA  TURMERIC PO Take by mouth.    [provider]     Positive ROS: Otherwise negative  All other systems have been reviewed and were otherwise negative  with the exception of those mentioned in the HPI and as above.  Physical Exam: Constitutional: Alert, well-appearing, no acute distress Ears: External ears without lesions or tenderness.  Left ear canal and TM are clear.  Right ear canal is obstructed with cerumen and this was cleaned with a suction.  After cleaning the cerumen from the right ear canal the right TM was clear.  Hearing screening with a tuning forks revealed good hearing in both ears with AC > BC bilaterally. Nasal: External nose without lesions. Septum slightly deviated to the left with moderate rhinitis mucosal swelling.  After decongesting the nose nasal endoscopy was performed and on nasal endoscopy she had mostly clear mucus discharge from the middle meatus.  No obvious mucopurulent discharge noted.  Posterior ethmoid and sphenoid region was clear.  Nasopharynx was clear..  Oral: Lips and gums without lesions. Tongue and palate mucosa without lesions. Posterior oropharynx clear.  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 Neck: No palpable adenopathy or masses Respiratory: Breathing comfortably  Skin: No facial/neck lesions or rash noted.  Cerumen impaction removal  Date/Time: 05/31/2020 4:59 PM Performed by: Drema Halon, MD Authorized by: Drema Halon, MD   Consent:    Consent obtained:  Verbal   Consent given by:  Patient   Risks discussed:  Pain and bleeding Procedure details:    Location:  R ear   Procedure type: curette and suction   Post-procedure details:    Inspection:  TM intact and canal normal   Hearing quality:  Improved   Patient tolerance of procedure:  Tolerated well, no immediate complications Comments:     Left ear canal was clear.  Right ear canal was occluded with cerumen. after removing this the TM was clear.    Assessment: Chronic rhinitis with history of acute sinus infections. Right ear symptoms related to cerumen  impaction.  Plan: Recommended regular use of nasal steroid spray and prescribed Nasacort 2 sprays each nostril at night as this should help with chronic nasal congestion and some of the mucus discharge.  Also reviewed with her concerning using saline irrigations for the nose to help with the excessive mucus discharge. Discussed with her that I think she would benefit by seeing an allergist to see if allergies are playing or role in her symptoms as she does have questionable asthma.  She also has some new pets that she is taking care of for her children. If she develops any yellow-green discharge prescribed Augmentin 875 mg twice daily for 10 days and day  Sterapred 10 mg 6-day Dosepak to take. If she continues to have chronic problems over the next couple of months discussed with her that further evaluation would require CT scan of the sinuses.   Narda Bonds, MD   CC:

## 2020-06-05 ENCOUNTER — Other Ambulatory Visit: Payer: Self-pay | Admitting: Internal Medicine

## 2020-06-19 ENCOUNTER — Other Ambulatory Visit: Payer: Self-pay | Admitting: Surgical

## 2020-06-20 NOTE — Telephone Encounter (Signed)
Please advise 

## 2020-11-25 ENCOUNTER — Telehealth: Payer: Self-pay | Admitting: *Deleted

## 2020-11-25 NOTE — Telephone Encounter (Signed)
Patient called requesting refill on Loestrin FE 1/20 mcg tablet. Patient annual exam due in July 2022. I called [facility] and patient has refill ready for pick up. I called patient back and told her this and told her to schedule annual exam for July. Patient verberlized she understood.

## 2021-01-20 DIAGNOSIS — M542 Cervicalgia: Secondary | ICD-10-CM | POA: Diagnosis not present

## 2021-01-20 DIAGNOSIS — Z87892 Personal history of anaphylaxis: Secondary | ICD-10-CM | POA: Diagnosis not present

## 2021-01-20 DIAGNOSIS — R519 Headache, unspecified: Secondary | ICD-10-CM | POA: Diagnosis not present

## 2021-01-20 DIAGNOSIS — Z79899 Other long term (current) drug therapy: Secondary | ICD-10-CM | POA: Diagnosis not present

## 2021-01-20 DIAGNOSIS — Z91018 Allergy to other foods: Secondary | ICD-10-CM | POA: Diagnosis not present

## 2021-01-20 DIAGNOSIS — Z91040 Latex allergy status: Secondary | ICD-10-CM | POA: Diagnosis not present

## 2021-01-20 DIAGNOSIS — Z793 Long term (current) use of hormonal contraceptives: Secondary | ICD-10-CM | POA: Diagnosis not present

## 2021-01-20 DIAGNOSIS — Z23 Encounter for immunization: Secondary | ICD-10-CM | POA: Diagnosis not present

## 2021-01-20 DIAGNOSIS — R0602 Shortness of breath: Secondary | ICD-10-CM | POA: Diagnosis not present

## 2021-01-20 DIAGNOSIS — R509 Fever, unspecified: Secondary | ICD-10-CM | POA: Diagnosis not present

## 2021-01-20 DIAGNOSIS — J029 Acute pharyngitis, unspecified: Secondary | ICD-10-CM | POA: Diagnosis not present

## 2021-01-20 DIAGNOSIS — M791 Myalgia, unspecified site: Secondary | ICD-10-CM | POA: Diagnosis not present

## 2021-01-20 DIAGNOSIS — H53149 Visual discomfort, unspecified: Secondary | ICD-10-CM | POA: Diagnosis not present

## 2021-01-20 DIAGNOSIS — U071 COVID-19: Secondary | ICD-10-CM | POA: Diagnosis not present

## 2021-01-23 ENCOUNTER — Encounter: Payer: Self-pay | Admitting: Nurse Practitioner

## 2021-01-23 ENCOUNTER — Telehealth (INDEPENDENT_AMBULATORY_CARE_PROVIDER_SITE_OTHER): Payer: 59 | Admitting: Nurse Practitioner

## 2021-01-23 ENCOUNTER — Telehealth: Payer: Self-pay

## 2021-01-23 DIAGNOSIS — U071 COVID-19: Secondary | ICD-10-CM

## 2021-01-23 MED ORDER — BENZONATATE 100 MG PO CAPS: 100.0000 mg | ORAL_CAPSULE | Freq: Three times a day (TID) | ORAL | 0 refills | Status: DC | PRN

## 2021-01-23 NOTE — Telephone Encounter (Signed)
Patient is calling in stating that was seen by Nche, and was given a work note but her employer is requesting that is specifically say "Monday 01/30/21". Can we re-write note for patient?

## 2021-01-23 NOTE — Progress Notes (Signed)
Virtual Visit via Video Note  I connected withNAME@ on 01/23/21 at  1:00 PM EDT by a video enabled telemedicine application and verified that I am speaking with the correct person using two identifiers.  Location: Patient:Home Provider: Office Participants: patient and provider  I discussed the limitations of evaluation and management by telemedicine and the availability of in person appointments. I also discussed with the patient that there may be a patient responsible charge related to this service. The patient expressed understanding and agreed to proceed.  XH:[id number] symptoms x5days.  History of Present Illness: Covid vaccine: none Symptom onset 5days ago Bebtelovimab 175mg - Monoclonal antibody infusion administered 01/21/2021 Reviewed 01/23/2021 Medical ED records 08/12 -01/21/21: provider notes, lab results (CBC, CMP, urinalysis, HCG and radiology report. CXR and CT-chest: no acute finding.  URI  This is a new problem. The current episode started in the past 7 days. The problem has been unchanged. The maximum temperature recorded prior to her arrival was 100.4 - 100.9 F. The fever has been present for 1 to 2 days. Associated symptoms include congestion, coughing, headaches, joint pain, rhinorrhea and sinus pain. Pertinent negatives include no abdominal pain, chest pain, diarrhea, dysuria, ear pain, joint swelling, nausea, neck pain, plugged ear sensation, rash, sneezing, sore throat, swollen glands, vomiting or wheezing. She has tried acetaminophen, increased fluids, NSAIDs and sleep (albuterol inhaler) for the symptoms. The treatment provided moderate relief.   Observations/Objective: Unable to provide any vital signs today. Physical Exam Constitutional:      General: She is not in acute distress.    Appearance: She is not diaphoretic.  Pulmonary:     Effort: Pulmonary effort is normal.  Neurological:     Mental Status: She is alert and oriented to person, place, and time.    Assessment and Plan: Jariyah was seen today for acute visit.  Diagnoses and all orders for this visit:  COVID-19 -     benzonatate (TESSALON) 100 MG capsule; Take 1-2 capsules (100-200 mg total) by mouth 3 (three) times daily as needed.  Follow Up Instructions: Provided worknote Advised to obtain a pulse oximetry and BP machine for daily vital sign check ED precautions given. Schedule for another video visit with pcp in 4days. Recommendation: symptom management with tylenol or ibuprofen for fever and pain, mucinex-dm or benzonatate for cough, albuterol for chest tightness or wheezing, [place] oral hydration, small frequent meals, go outside daily for fresh air and sunlight.  I discussed the assessment and treatment plan with the patient. The patient was provided an opportunity to ask questions and all were answered. The patient agreed with the plan and demonstrated an understanding of the instructions.   The patient was advised to call back or seek an in-person evaluation if the symptoms worsen or if the condition fails to improve as anticipated.   Raye Sorrow, NP

## 2021-01-23 NOTE — Patient Instructions (Addendum)
Provided worknote Advised to obtain a pulse oximetry and BP machine for daily vital sign check ED precautions given. Schedule for another video visit with pcp in 4days. Recommendation: symptom management with tylenol or ibuprofen for fever and pain, mucinex-dm or benzonatate for cough, albuterol for chest tightness or wheezing, [place] oral hydration, small frequent meals, go outside daily for fresh air and sunlight. COVID-19: What to Do if You Are Sick CDC has updated isolation and quarantine recommendations for the public, and is revising the CDC website to reflect these changes. These recommendations do not apply to healthcare personnel and do not supersede state, local, tribal, or territorial laws, rules, andregulations. If you have a fever, cough or other symptoms, you might have COVID-19. Most people have mild illness and are able to recover at home. If you are sick: Keep track of your symptoms. If you have an emergency warning sign (including trouble breathing), call 911. Steps to help prevent the spread of COVID-19 if you are sick If you are sick with COVID-19 or think you might have COVID-19, follow the steps below to care for yourself and to help protect other peoplein your home and community. Stay home except to get medical care Stay home. Most people with COVID-19 have mild illness and can recover at home without medical care. Do not leave your home, except to get medical care. Do not visit public areas. Take care of yourself. Get rest and stay hydrated. Take over-the-counter medicines, such as acetaminophen, to help you feel better. Stay in touch with your doctor. Call before you get medical care. Be sure to get care if you have trouble breathing, or have any other emergency warning signs, or if you think it is an emergency. Avoid public transportation, ride-sharing, or taxis. Separate yourself from other people As much as possible, stay in a specific room and away from other people  and pets in your home. If possible, you should use a separate bathroom. If you need to be around other people or animals in oroutside of the home, wear a mask. Tell your close contactsthat they may have been exposed to COVID-19. An infected person can spread COVID-19 starting 48 hours (or 2 days) before the person has any symptoms or tests positive. By letting your close contacts know they may have been exposed to COVID-19, you are helping to protect everyone. Additional guidance is available for those living in close quarters and shared housing. See COVID-19 and Animals if you have questions about pets. If you are diagnosed with COVID-19, someone from the health department may call you. Answer the call to slow the spread. Monitor your symptoms Symptoms of COVID-19 include fever, cough, or other symptoms. Follow care instructions from your healthcare provider and local health department. Your local health authorities may give instructions on checking your symptoms and reporting information. When to seek emergency medical attention Look for emergency warning signs* for COVID-19. If someone is showing any of these signs, seek emergency medical care immediately: Trouble breathing Persistent pain or pressure in the chest New confusion Inability to wake or stay awake Pale, gray, or blue-colored skin, lips, or nail beds, depending on skin tone *This list is not all possible symptoms. Please call your medical provider forany other symptoms that are severe or concerning to you. Call 911 or call ahead to your local emergency facility: Notify the operator that you are seeking care for someone who has or may haveCOVID-19. Call ahead before visiting your doctor Call ahead. Many medical visits for  routine care are being postponed or done by phone or telemedicine. If you have a medical appointment that cannot be postponed, call your doctor's office, and tell them you have or may have COVID-19. This will help  the office protect themselves and other patients. Get tested If you have symptoms of COVID-19, get tested. While waiting for test results, you stay away from others, including staying apart from those living in your household. Self-tests are one of several options for testing for the virus that causes COVID-19 and may be more convenient than laboratory-based tests and point-of-care tests. Ask your healthcare provider or your local health department if you need help interpreting your test results. You can visit your state, tribal, local, and territorial health department's website to look for the latest local information on testing sites. If you are sick, wear a mask over your nose and mouth You should wear a mask over your nose and mouth if you must be around other people or animals, including pets (even at home). You don't need to wear the mask if you are alone. If you can't put on a mask (because of trouble breathing, for example), cover your coughs and sneezes in some other way. Try to stay at least 6 feet away from other people. This will help protect the people around you. Masks should not be placed on young children under age 60 years, anyone who has trouble breathing, or anyone who is not able to remove the mask without help. Note: During the COVID-19 pandemic, medical grade facemasks are reserved forhealthcare workers and some first responders. Cover your coughs and sneezes Cover your mouth and nose with a tissue when you cough or sneeze. Throw away used tissues in a lined trash can. Immediately wash your hands with soap and water for at least 20 seconds. If soap and water are not available, clean your hands with an alcohol-based hand sanitizer that contains at least 60% alcohol. Clean your hands often Wash your hands often with soap and water for at least 20 seconds. This is especially important after blowing your nose, coughing, or sneezing; going to the bathroom; and before eating or  preparing food. Use hand sanitizer if soap and water are not available. Use an alcohol-based hand sanitizer with at least 60% alcohol, covering all surfaces of your hands and rubbing them together until they feel dry. Soap and water are the best option, especially if hands are visibly dirty. Avoid touching your eyes, nose, and mouth with unwashed hands. Handwashing Tips Avoid sharing personal household items Do not share dishes, drinking glasses, cups, eating utensils, towels, or bedding with other people in your home. Wash these items thoroughly after using them with soap and water or put in the dishwasher. Clean all "high-touch" surfaces every day Clean and disinfect high-touch surfaces in your "sick room" and bathroom; wear disposable gloves. Let someone else clean and disinfect surfaces in common areas, but you should clean your bedroom and bathroom, if possible. If a caregiver or other person needs to clean and disinfect a sick person's bedroom or bathroom, they should do so on an as-needed basis. The caregiver/other person should wear a mask and disposable gloves prior to cleaning. They should wait as long as possible after the person who is sick has used the bathroom before coming in to clean and use the bathroom. High-touch surfaces include phones, remote controls, counters, tabletops, doorknobs, bathroom fixtures, toilets, keyboards, tablets, and bedside tables. Clean and disinfect areas that may have blood, stool, or  body fluids on them. Use household cleaners and disinfectants. Clean the area or item with soap and water or another detergent if it is dirty. Then, use a household disinfectant. Be sure to follow the instructions on the label to ensure safe and effective use of the product. Many products recommend keeping the surface wet for several minutes to ensure germs are killed. Many also recommend precautions such as wearing gloves and making sure you have good ventilation during use of  the product. Use a product from [employer] List N: Disinfectants for Coronavirus (COVID-19). Complete Disinfection Guidance When you can be around others after being sick with COVID-19 Deciding when you can be around others is different for different situations. Find out when you can safely end home isolation. For any additional questions about your care,contact your healthcare provider or state or local health department. 05/18/2020 Content source: [facility] ([facility]), Division of Viral Diseases This information is not intended to replace advice given to you by your health care provider. Make sure you discuss any questions you have with your healthcare provider. Document Revised: 07/15/2020 Document Reviewed: 07/15/2020 Elsevier Patient Education  2022 [employer].

## 2021-01-24 NOTE — Telephone Encounter (Signed)
Patient was calling for update on note she is needing a return date to work on the note please advise

## 2021-01-27 ENCOUNTER — Telehealth (INDEPENDENT_AMBULATORY_CARE_PROVIDER_SITE_OTHER): Payer: 59 | Admitting: Physician Assistant

## 2021-01-27 ENCOUNTER — Encounter: Payer: Self-pay | Admitting: Physician Assistant

## 2021-01-27 ENCOUNTER — Telehealth: Payer: Self-pay

## 2021-01-27 DIAGNOSIS — U071 COVID-19: Secondary | ICD-10-CM

## 2021-01-27 MED ORDER — PREDNISONE 20 MG PO TABS: 40.0000 mg | ORAL_TABLET | Freq: Every day | ORAL | 0 refills | Status: DC

## 2021-01-27 NOTE — Telephone Encounter (Signed)
Note has been written and sent to patient via my chart.

## 2021-01-27 NOTE — Telephone Encounter (Signed)
Patient is calling in stating that the letter for work needs to cover from 8/15. Wanting to know if this can be sent in today as she needs it for work purposes. 8-15 to current.
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
## 2021-01-31 ENCOUNTER — Ambulatory Visit: Payer: 59

## 2021-02-14 ENCOUNTER — Other Ambulatory Visit: Payer: Self-pay | Admitting: Obstetrics & Gynecology

## 2021-02-14 DIAGNOSIS — N912 Amenorrhea, unspecified: Secondary | ICD-10-CM

## 2021-02-14 NOTE — Telephone Encounter (Signed)
LMOVM that needs appt scheduled before refill can be sent in

## 2021-02-15 ENCOUNTER — Telehealth (HOSPITAL_BASED_OUTPATIENT_CLINIC_OR_DEPARTMENT_OTHER): Payer: Self-pay | Admitting: Obstetrics & Gynecology

## 2021-02-15 NOTE — Telephone Encounter (Signed)
Called patient and left message to call the office to schedule her annual visit  with Dr.Miller .

## 2021-03-23 ENCOUNTER — Telehealth (HOSPITAL_BASED_OUTPATIENT_CLINIC_OR_DEPARTMENT_OTHER): Payer: Self-pay | Admitting: *Deleted

## 2021-03-23 DIAGNOSIS — N912 Amenorrhea, unspecified: Secondary | ICD-10-CM

## 2021-03-23 MED ORDER — NORETHIN ACE-ETH ESTRAD-FE 1-20 MG-MCG PO TABS: 1.0000 | ORAL_TABLET | Freq: Every day | ORAL | 0 refills | Status: DC

## 2021-03-23 NOTE — Telephone Encounter (Signed)
Called for refill request.  Has apt in Dec 2022 with Dr Hyacinth Meeker. KW CMA

## 2021-05-15 ENCOUNTER — Ambulatory Visit (HOSPITAL_BASED_OUTPATIENT_CLINIC_OR_DEPARTMENT_OTHER): Payer: 59 | Admitting: Obstetrics & Gynecology

## 2021-05-22 ENCOUNTER — Ambulatory Visit (INDEPENDENT_AMBULATORY_CARE_PROVIDER_SITE_OTHER): Payer: BC Managed Care – PPO | Admitting: Family Medicine

## 2021-05-22 ENCOUNTER — Encounter: Payer: Self-pay | Admitting: Family Medicine

## 2021-05-22 ENCOUNTER — Other Ambulatory Visit: Payer: Self-pay

## 2021-05-22 VITALS — BP 148/78 | HR 85 | Temp 98.2°F | Wt 256.2 lb

## 2021-05-22 DIAGNOSIS — K649 Unspecified hemorrhoids: Secondary | ICD-10-CM

## 2021-05-22 MED ORDER — HYDROCORT-PRAMOXINE (PERIANAL) 2.5-1 % EX CREA: TOPICAL_CREAM | Freq: Three times a day (TID) | CUTANEOUS | Status: DC

## 2021-05-22 NOTE — Progress Notes (Signed)
Subjective:     Patient ID: Julia Schultz, female    DOB: [birth year], 40 y.o.   MRN: [id number]  Chief Complaint  Patient presents with   Hemorrhoids    Started Sunday morning, no bleeding. Chronic constipation. Tried welch hazel pad with no relief.     HPI Here as pain from Hemorrhoids severe now.  Have been smaller and witch hazel usu works. But now, worse.  Last wk, more constipated and "popped out" 2 nights ago.  Chronic constipation-takes miralax occ. Usually goes q2days. No anal intercourse.  Had been out dancing a lot night of.  Using preparation H since last pm.   No bleeding. Just pain at site.  No abd pain.  No pregnancy Pt is a bus driver.  There are no preventive care reminders to display for this patient.  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   Anemia    Complication of anesthesia    woke as a child during procedure   GERD (gastroesophageal reflux disease)    Headache    migraines   Menorrhagia    Thrombocytopenia (HCC) 2005    Past Surgical History:  Procedure Laterality Date   BREAST SURGERY  2003   breast reduction    CESAREAN SECTION     x 3   CHOLECYSTECTOMY N/A 08/10/2015   Procedure: LAPAROSCOPIC CHOLECYSTECTOMY WITH INTRAOPERATIVE CHOLANGIOGRAM;  Surgeon: Manus Rudd, MD;  Location: [facility] OR;  Service: General;  Laterality: N/A;   COLPOSCOPY  2018   FOOT SURGERY Left child 4th grade   correct arch   TUBAL LIGATION Bilateral 2010    Outpatient Medications Prior to Visit  Medication Sig Dispense Refill   Acetaminophen (TYLENOL 8 HOUR PO) Take by mouth.     albuterol (PROAIR HFA) 108 (90 Base) MCG/ACT inhaler Inhale 2 puffs into the lungs every 6 (six) hours as needed for wheezing or shortness of breath. 1 each 0   ELDERBERRY PO Take by mouth.     IBUPROFEN PO Take by mouth.     norethindrone-ethinyl estradiol-FE (LOESTRIN FE) 1-20 MG-MCG tablet Take 1 tablet by mouth daily. 84 tablet 0    TURMERIC PO Take by mouth.     meloxicam (MOBIC) 15 MG tablet TAKE 1 TABLET(15 MG) BY MOUTH DAILY (Patient not taking: Reported on 05/22/2021) 30 tablet 2   benzonatate (TESSALON) 100 MG capsule Take 1-2 capsules (100-200 mg total) by mouth 3 (three) times daily as needed. (Patient not taking: Reported on 01/27/2021) 20 capsule 0   montelukast (SINGULAIR) 10 MG tablet Take 1 tablet (10 mg total) by mouth at bedtime. (Patient not taking: Reported on 01/27/2021) 30 tablet 0   predniSONE (DELTASONE) 20 MG tablet Take 2 tablets (40 mg total) by mouth daily. (Patient not taking: Reported on 05/22/2021) 10 tablet 0   No facility-administered medications prior to visit.    Allergies  Allergen Reactions   Banana Anaphylaxis    "lump in throat"   Latex Itching   Other     Walnuts cause itchy throat   Tomato Hives and Swelling   YPP:[id number] except as noted in HPI      Objective:    Gen: WDWN NAD MOAAF HEENT: NCAT, conjunctiva not injected, sclera nonicteric NECK:  supple, no thyromegaly, no nodes, no carotid bruits CARDIAC: RRR, S1S2+, no murmur. DP 2+B LUNGS: CTAB. No wheezes ABDOMEN:  BS+, soft, NTND, No HSM, no masses Chaperone  present-large, hemorrhoid at 6:00 EXT:  no edema MSK: no gross abnormalities.  NEURO: A&O x3.  CN II-XII intact.  PSYCH: normal mood. Good eye contact   BP (!) 148/78   Pulse 85   Temp 98.2 F (36.8 C) (Temporal)   Wt 256 lb 3.2 oz (116.2 kg)   LMP 05/03/2021 (Approximate)   SpO2 97%   BMI 44.32 kg/m  Wt Readings from Last 3 Encounters:  05/22/21 256 lb 3.2 oz (116.2 kg)  05/13/20 248 lb 3.2 oz (112.6 kg)  05/03/20 244 lb 6.1 oz (110.9 kg)       Assessment & Plan:   Problem List Items Addressed This Visit   None Visit Diagnoses     Hemorrhoids, unspecified hemorrhoid type    -  Primary     Hemorrhoids-soaks, no preparation H(may be inc bp). Tylenol.  Proctofoam.  Worse/no change-refer.   Heavy, uncontrollable bleeding,  to ER.  Take miralax daily and drink water for prevention.  Sch annual physical  No orders of the defined types were placed in this encounter.   Angelena Sole., MD

## 2021-05-22 NOTE — Patient Instructions (Addendum)
It was very nice to see you today!  Take the procofoam rectally.  Soaks.miralax daily.    PLEASE NOTE:  If you had any lab tests please let us know if you have not heard back within a few days. You may see your results on MyChart before we have a chance to review them but we will give you a call once they are reviewed by [place]. If we ordered any referrals today, please let us know if you have not heard from their office within the next week.   Please try these tips to maintain a healthy lifestyle:  Eat most of your calories during the day when you are active. Eliminate processed foods including packaged sweets (pies, cakes, cookies), reduce intake of potatoes, white bread, white pasta, and white rice. Look for whole grain options, oat flour or almond flour.  Each meal should contain half fruits/vegetables, one quarter protein, and one quarter carbs (no bigger than a computer mouse).  Cut down on sweet beverages. This includes juice, soda, and sweet tea. Also watch fruit intake, though this is a healthier sweet option, it still contains natural sugar! Limit to 3 servings daily.  Drink at least 1 glass of water with each meal and aim for at least 8 glasses per day  Exercise at least 150 minutes every week.

## 2021-05-23 ENCOUNTER — Telehealth: Payer: Self-pay

## 2021-05-23 ENCOUNTER — Other Ambulatory Visit: Payer: Self-pay

## 2021-05-23 MED ORDER — HYDROCORT-PRAMOXINE (PERIANAL) 2.5-1 % EX CREA: 1.0000 "application " | TOPICAL_CREAM | Freq: Three times a day (TID) | CUTANEOUS | 0 refills | Status: DC

## 2021-05-23 NOTE — Telephone Encounter (Signed)
Pt called stating that she saw Dr Ruthine Dose yesterday and a prescription was supposed to be called in. Pt stated that the pharmacy has not received the request. Please Advise.

## 2021-05-23 NOTE — Telephone Encounter (Signed)
Initial Comment Caller states, was seen today at 3pm. No mediation was sent o pharmacy. Saw Dr. Jeani Sow today

## 2021-05-23 NOTE — Telephone Encounter (Signed)
D/w Milinda Hirschfeld

## 2021-05-23 NOTE — Addendum Note (Signed)
Addended by: Erick Alley L on: 05/23/2021 01:30 PM   Modules accepted: Orders

## 2021-05-24 NOTE — Telephone Encounter (Signed)
Called pt and confirmed she picked up her medication from the pharmacy yesterday.

## 2021-06-01 ENCOUNTER — Other Ambulatory Visit (HOSPITAL_BASED_OUTPATIENT_CLINIC_OR_DEPARTMENT_OTHER): Payer: Self-pay

## 2021-06-01 DIAGNOSIS — N912 Amenorrhea, unspecified: Secondary | ICD-10-CM

## 2021-06-01 MED ORDER — NORETHIN ACE-ETH ESTRAD-FE 1-20 MG-MCG PO TABS
1.0000 | ORAL_TABLET | Freq: Every day | ORAL | 0 refills | Status: DC
Start: 2021-06-01 — End: 2021-06-08

## 2021-06-08 ENCOUNTER — Other Ambulatory Visit (HOSPITAL_BASED_OUTPATIENT_CLINIC_OR_DEPARTMENT_OTHER): Payer: Self-pay | Admitting: Obstetrics & Gynecology

## 2021-06-08 DIAGNOSIS — N912 Amenorrhea, unspecified: Secondary | ICD-10-CM

## 2021-06-09 MED ORDER — NORETHIN ACE-ETH ESTRAD-FE 1-20 MG-MCG PO TABS: 1.0000 | ORAL_TABLET | Freq: Every day | ORAL | 0 refills | Status: DC

## 2021-06-29 ENCOUNTER — Ambulatory Visit (INDEPENDENT_AMBULATORY_CARE_PROVIDER_SITE_OTHER): Payer: BC Managed Care – PPO | Admitting: Obstetrics & Gynecology

## 2021-06-29 ENCOUNTER — Encounter (HOSPITAL_BASED_OUTPATIENT_CLINIC_OR_DEPARTMENT_OTHER): Payer: Self-pay | Admitting: Obstetrics & Gynecology

## 2021-06-29 ENCOUNTER — Other Ambulatory Visit: Payer: Self-pay

## 2021-06-29 ENCOUNTER — Other Ambulatory Visit (HOSPITAL_COMMUNITY)
Admission: RE | Admit: 2021-06-29 | Discharge: 2021-06-29 | Disposition: A | Discharge status: Home / Self Care | Payer: BC Managed Care – PPO | Source: Ambulatory Visit | Attending: Obstetrics & Gynecology | Admitting: Obstetrics & Gynecology

## 2021-06-29 VITALS — BP 121/82 | HR 72 | Ht 64.0 in | Wt 253.8 lb

## 2021-06-29 DIAGNOSIS — Z01419 Encounter for gynecological examination (general) (routine) without abnormal findings: Secondary | ICD-10-CM

## 2021-06-29 DIAGNOSIS — Z0000 Encounter for general adult medical examination without abnormal findings: Secondary | ICD-10-CM | POA: Diagnosis not present

## 2021-06-29 DIAGNOSIS — K5909 Other constipation: Secondary | ICD-10-CM

## 2021-06-29 DIAGNOSIS — Z1231 Encounter for screening mammogram for malignant neoplasm of breast: Secondary | ICD-10-CM

## 2021-06-29 DIAGNOSIS — Z124 Encounter for screening for malignant neoplasm of cervix: Secondary | ICD-10-CM | POA: Insufficient documentation

## 2021-06-29 DIAGNOSIS — E2839 Other primary ovarian failure: Secondary | ICD-10-CM | POA: Diagnosis not present

## 2021-06-29 DIAGNOSIS — K649 Unspecified hemorrhoids: Secondary | ICD-10-CM

## 2021-06-29 DIAGNOSIS — Z113 Encounter for screening for infections with a predominantly sexual mode of transmission: Secondary | ICD-10-CM | POA: Diagnosis not present

## 2021-06-29 DIAGNOSIS — Z6841 Body Mass Index (BMI) 40.0 and over, adult: Secondary | ICD-10-CM

## 2021-06-29 MED ORDER — HYDROCORTISONE (PERIANAL) 2.5 % EX CREA
TOPICAL_CREAM | Freq: Two times a day (BID) | CUTANEOUS | 0 refills | Status: DC
Start: 2021-06-29 — End: 2023-05-01

## 2021-06-29 NOTE — Patient Instructions (Signed)
[facility] [address], [place],  [address] Phone: [phone] 

## 2021-06-29 NOTE — Progress Notes (Signed)
41 y.o. [id number] Legally Separated Black or [place] American female here for annual exam.  She is frustrated with her weight.  Feels tired a lot of the time.    Reports she had a significant sinus infection in 05/2020.  She was treated with antibiotics.  She did see an ENT and was advised to use saline.  She is on OTC allegra.  Since having Covid, allergies seem to be much worse.  She is having more coughing as well.    Cycles are regular and last 2-3 days.  Flow is fairly light.  Last month, she basically skipped her cycle.   Has chronic constipation unless eats spicy food because this will cause her to have loose or watery stools.  Reports she sometimes eats spicy foods just to have a bowel movement.  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        Sexually active: Yes.    The current method of family planning is OCP (estrogen/progesterone).    Smoker:  no  Health Maintenance: Pap:  12/18/19 neg History of abnormal Pap:  2018, LGSIL MMG:  guidelines reviewed Colonoscopy:  guidelines reviewed Screening Labs: will have STD testing today   reports that she has never smoked. She has never used smokeless tobacco. She reports current alcohol use. She reports that she does not use drugs.  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   Anemia    Complication of anesthesia    woke as a child during procedure   GERD (gastroesophageal reflux disease)    Headache    migraines   Menorrhagia    Thrombocytopenia ([place]) 2005    Past Surgical History:  Procedure Laterality Date   BREAST SURGERY  2003   breast reduction    CESAREAN SECTION     x 3   CHOLECYSTECTOMY N/A 08/10/2015   Procedure: LAPAROSCOPIC CHOLECYSTECTOMY WITH INTRAOPERATIVE CHOLANGIOGRAM;  Surgeon: Donnie Mesa, MD;  Location: [facility];  Service: General;  Laterality: N/A;   COLPOSCOPY  2018   FOOT SURGERY Left child 4th grade   correct arch    TUBAL LIGATION Bilateral 2010    Current Outpatient Medications  Medication Sig Dispense Refill   Acetaminophen (TYLENOL 8 HOUR PO) Take by mouth.     albuterol (PROAIR HFA) 108 (90 Base) MCG/ACT inhaler Inhale 2 puffs into the lungs every 6 (six) hours as needed for wheezing or shortness of breath. 1 each 0   ELDERBERRY PO Take by mouth.     hydrocortisone (ANUSOL-HC) 2.5 % rectal cream Place rectally 2 (two) times daily. Use for 7-10 days 30 g 0   IBUPROFEN PO Take by mouth.     meloxicam (MOBIC) 15 MG tablet TAKE 1 TABLET(15 MG) BY MOUTH DAILY 30 tablet 2   norethindrone-ethinyl estradiol-FE (LOESTRIN FE) 1-20 MG-MCG tablet Take 1 tablet by mouth daily. 84 tablet 0   TURMERIC PO Take by mouth.     No current facility-administered medications for this visit.    Family History  Problem Relation Age of Onset   Hyperlipidemia Mother    Hypertension Mother    Diabetes Mother    Diverticulitis Mother    Pancreatic cancer Father     Review of Systems  All other systems reviewed and are negative.  Exam:   BP 121/82    Pulse 72    Ht 5\' 4"  (1.626 m)  Wt 253 lb 12.8 oz (115.1 kg)    BMI 43.56 kg/m   Height: 5\' 4"  (162.6 cm)  General appearance: alert, cooperative and appears stated age Head: Normocephalic, without obvious abnormality, atraumatic Neck: no adenopathy, supple, symmetrical, trachea midline and thyroid normal to inspection and palpation Lungs: clear to auscultation bilaterally Breasts: normal appearance, no masses or tenderness Heart: regular rate and rhythm Abdomen: soft, non-tender; bowel sounds normal; no masses,  no organomegaly Extremities: extremities normal, atraumatic, no cyanosis or edema Skin: Skin color, texture, turgor normal. No rashes or lesions Lymph nodes: Cervical, supraclavicular, and axillary nodes normal. No abnormal inguinal nodes palpated Neurologic: Grossly normal  Pelvic: External genitalia:  no lesions              Urethra:  normal  appearing urethra with no masses, tenderness or lesions              Bartholins and Skenes: normal                 Vagina: normal appearing vagina with normal color and no discharge, no lesions              Cervix: no lesions              Pap taken: Yes.   Bimanual Exam:  Uterus:  normal size, contour, position, consistency, mobility, non-tender              Adnexa: normal adnexa and no mass, fullness, tenderness               Rectovaginal: Confirms               Anus:  normal sphincter tone, no lesions  Chaperone, Fredia Sorrow, CMA, was present for exam.  Assessment/Plan: 1. Well woman exam with routine gynecological exam - pap obtained today per pt request - MMG guidelines reviewed.  Order placed.  Recommended starting this year. - colon cancer screening guidelines reviewed - vaccines reviewed/updated - lab orders as per below  2. Premature ovarian failure -on OCPs and will continue. Pharmacy to send RF request with needed.  3. Screening examination for STD (sexually transmitted disease) - Cytology - PAP( [place]) - Hepatitis C antibody - RPR+HBsAg+HIV  4. Encounter for screening mammogram for malignant neoplasm of breast - MM 3D SCREEN BREAST BILATERAL; Future  5. Chronic constipation - Ambulatory referral to Gastroenterology  6. Blood tests for routine general physical examination - Hemoglobin A1c - TSH  7. BMI 40.0-44.9, adult ([place]) - pt frustrated with weight.  Referral to Healthy Weight and Wellness placed - Ambulatory referral to [facility]  8. Hemorrhoids, unspecified hemorrhoid type - hydrocortisone (ANUSOL-HC) 2.5 % rectal cream; Place rectally 2 (two) times daily. Use for 7-10 days  Dispense: 30 g; Refill: 0

## 2021-06-30 LAB — HEPATITIS C ANTIBODY: Hep C Virus Ab: <0.1 s/co ratio (ref 0.0–0.9)

## 2021-06-30 LAB — HEMOGLOBIN A1C
Est. average glucose Bld gHb Est-mCnc: 120 mg/dL
Hgb A1c MFr Bld: 5.8 % — ABNORMAL HIGH (ref 4.8–5.6)

## 2021-06-30 LAB — RPR+HBSAG+HIV
HIV Screen 4th Generation wRfx: NONREACTIVE
Hepatitis B Surface Ag: NEGATIVE
RPR Ser Ql: NONREACTIVE

## 2021-06-30 LAB — TSH: TSH: 1.18 u[IU]/mL (ref 0.450–4.500)

## 2021-07-03 LAB — CYTOLOGY - PAP
Adequacy: ABSENT
Chlamydia: NEGATIVE
Comment: NEGATIVE
Comment: NEGATIVE
Comment: NEGATIVE
Comment: NORMAL
Diagnosis: NEGATIVE
High risk HPV: NEGATIVE
Neisseria Gonorrhea: NEGATIVE
Trichomonas: NEGATIVE

## 2021-07-18 ENCOUNTER — Encounter (HOSPITAL_BASED_OUTPATIENT_CLINIC_OR_DEPARTMENT_OTHER): Payer: Self-pay | Admitting: Radiology

## 2021-07-18 ENCOUNTER — Ambulatory Visit (HOSPITAL_BASED_OUTPATIENT_CLINIC_OR_DEPARTMENT_OTHER)
Admission: RE | Admit: 2021-07-18 | Discharge: 2021-07-18 | Disposition: A | Discharge status: Home / Self Care | Payer: BC Managed Care – PPO | Source: Ambulatory Visit | Attending: Obstetrics & Gynecology | Admitting: Obstetrics & Gynecology

## 2021-07-18 ENCOUNTER — Other Ambulatory Visit: Payer: Self-pay

## 2021-07-18 DIAGNOSIS — Z1231 Encounter for screening mammogram for malignant neoplasm of breast: Secondary | ICD-10-CM | POA: Insufficient documentation

## 2021-08-02 ENCOUNTER — Encounter: Payer: Self-pay | Admitting: Obstetrics & Gynecology

## 2021-11-03 ENCOUNTER — Encounter: Payer: Self-pay | Admitting: Physician Assistant

## 2021-11-03 NOTE — Telephone Encounter (Signed)
Pt called in about "speaking with PCP". Pt scheduled an appointment for 05/30 at 8:20am and will discuss in-person

## 2021-11-07 ENCOUNTER — Ambulatory Visit (INDEPENDENT_AMBULATORY_CARE_PROVIDER_SITE_OTHER): Payer: BC Managed Care – PPO | Admitting: Physician Assistant

## 2021-11-07 ENCOUNTER — Telehealth: Payer: Self-pay | Admitting: Physician Assistant

## 2021-11-07 ENCOUNTER — Encounter: Payer: Self-pay | Admitting: Physician Assistant

## 2021-11-07 VITALS — BP 130/80 | HR 96 | Temp 97.5°F | Ht 64.0 in | Wt 256.5 lb

## 2021-11-07 DIAGNOSIS — R0683 Snoring: Secondary | ICD-10-CM | POA: Diagnosis not present

## 2021-11-07 DIAGNOSIS — R0981 Nasal congestion: Secondary | ICD-10-CM | POA: Diagnosis not present

## 2021-11-07 DIAGNOSIS — K5900 Constipation, unspecified: Secondary | ICD-10-CM

## 2021-11-07 DIAGNOSIS — E669 Obesity, unspecified: Secondary | ICD-10-CM

## 2021-11-07 LAB — CBC WITH DIFFERENTIAL/PLATELET
Basophils Absolute: 0 10*3/uL (ref 0.0–0.1)
Basophils Relative: 0.6 % (ref 0.0–3.0)
Eosinophils Absolute: 0.5 10*3/uL (ref 0.0–0.7)
Eosinophils Relative: 6.4 % — ABNORMAL HIGH (ref 0.0–5.0)
HCT: 39.4 % (ref 36.0–46.0)
Hemoglobin: 13.4 g/dL (ref 12.0–15.0)
Lymphocytes Relative: 21.7 % (ref 12.0–46.0)
Lymphs Abs: 1.6 10*3/uL (ref 0.7–4.0)
MCHC: 34 g/dL (ref 30.0–36.0)
MCV: 89.7 fl (ref 78.0–100.0)
Monocytes Absolute: 0.4 10*3/uL (ref 0.1–1.0)
Monocytes Relative: 5.7 % (ref 3.0–12.0)
Neutro Abs: 4.9 10*3/uL (ref 1.4–7.7)
Neutrophils Relative %: 65.6 % (ref 43.0–77.0)
Platelets: 150 10*3/uL (ref 150.0–400.0)
RBC: 4.4 Mil/uL (ref 3.87–5.11)
RDW: 13.8 % (ref 11.5–15.5)
WBC: 7.5 10*3/uL (ref 4.0–10.5)

## 2021-11-07 LAB — COMPREHENSIVE METABOLIC PANEL
ALT: 25 U/L (ref 0–35)
AST: 21 U/L (ref 0–37)
Albumin: 4.2 g/dL (ref 3.5–5.2)
Alkaline Phosphatase: 42 U/L (ref 39–117)
BUN: 11 mg/dL (ref 6–23)
CO2: 26 mEq/L (ref 19–32)
Calcium: 8.9 mg/dL (ref 8.4–10.5)
Chloride: 104 mEq/L (ref 96–112)
Creatinine, Ser: 0.92 mg/dL (ref 0.40–1.20)
GFR: 77.52 mL/min (ref >60.00)
Glucose, Bld: 107 mg/dL — ABNORMAL HIGH (ref 70–99)
Potassium: 4.1 mEq/L (ref 3.5–5.1)
Sodium: 137 mEq/L (ref 135–145)
Total Bilirubin: 0.4 mg/dL (ref 0.2–1.2)
Total Protein: 6.9 g/dL (ref 6.0–8.3)

## 2021-11-07 LAB — LIPID PANEL
Cholesterol: 174 mg/dL (ref 0–200)
HDL: 53 mg/dL (ref >39.00)
LDL Cholesterol: 106 mg/dL — ABNORMAL HIGH (ref 0–99)
NonHDL: 121.24
Total CHOL/HDL Ratio: 3
Triglycerides: 77 mg/dL (ref 0.0–149.0)
VLDL: 15.4 mg/dL (ref 0.0–40.0)

## 2021-11-07 MED ORDER — FLUCONAZOLE 150 MG PO TABS: 150.0000 mg | ORAL_TABLET | Freq: Once | ORAL | 0 refills | Status: AC

## 2021-11-07 MED ORDER — AMOXICILLIN-POT CLAVULANATE 875-125 MG PO TABS: 1.0000 | ORAL_TABLET | Freq: Two times a day (BID) | ORAL | 0 refills | Status: DC

## 2021-11-07 MED ORDER — WEGOVY 0.25 MG/0.5ML ~~LOC~~ SOAJ
0.2500 mg | SUBCUTANEOUS | 1 refills | Status: DC
Start: 2021-11-07 — End: 2021-12-18

## 2021-11-07 NOTE — Telephone Encounter (Signed)
Pt states she forgot to ask PCP about getting "diflucan" along with the antibiotic that was prescribed so she will not have a yeast infection.   Please call pt with follow up.  Same pharmacy as other Rx sent in today (11/07/21):  [facility] [place], [place] - [address], [place] [address]  Phone:  [phone]  Fax:  [phone]

## 2021-11-07 NOTE — Telephone Encounter (Signed)
Pt notified Diflucan was sent to pharmacy.

## 2021-11-07 NOTE — Progress Notes (Signed)
Julia Schultz is a 41 y.o. female here for a new problem.  History of Present Illness:   Chief Complaint  Patient presents with   Sinus Problem    Pt c/o sinus pressure since Saturday, brown nasal drainage, no fever or chills   Needs Sleep study    She was told by employer and was told needs sleep study.    HPI  Sinusitis  Patient complain of sinus drainage/pressure that has present for the past 4 days. She has been experiencing brown nasal drainage as well. She has longstanding hx of sinus issue. She was prescribed Doxycycline and Levaquin in the past with some relief. She notes she still has sinus drainage and pressure. She has seen her ENT and was told she has excessive amount of mucus. She was prescribed antibiotics in that visit. However, she has not started this since she has finished antibiotics before with no relief. Symptoms seems to be worsening. She has been taking Benadryl, Flonase and Mucinex. This has not been helping. She is concerned this might be another sinus infection. She would like to be further evaluated for her symptoms. Denies chest pain or dizziness. No reported fever or chills.   Obesity  Patient is concerned about her weight gain. She has been trying to follow a healthy diet. She is under a lot of stress and has been not taking care of herself. She has tried several diet in the past which did not helped. She would like to be started on Wegovy at this time. Denies shortness of breath or trouble sleeping.   Sleep apnea  She notes her employee has advised her to get  noticed this and she would like this to be further evaluated. Does snore at night. Denies headache or difficulty staying asleep.   Constipation Long standing issue for patient. She has tried miralax in the past with mild relief but does not take this consistently enough. Currently dealing with constipation per patient. Denies: rectal bleeding, diarrhea, severe abdominal pain.   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   Anemia    Complication of anesthesia    woke as a child during procedure   GERD (gastroesophageal reflux disease)    Headache    migraines   Menorrhagia    Thrombocytopenia ([facility]) 2005     Social History   Tobacco Use   Smoking status: Never   Smokeless tobacco: Never  Vaping Use   Vaping Use: Never used  Substance Use Topics   Alcohol use: Yes    Comment: occ   Drug use: No    Past Surgical History:  Procedure Laterality Date   BREAST SURGERY  2003   breast reduction    CESAREAN SECTION     x 3   CHOLECYSTECTOMY N/A 08/10/2015   Procedure: LAPAROSCOPIC CHOLECYSTECTOMY WITH INTRAOPERATIVE CHOLANGIOGRAM;  Surgeon: Manus RuddMatthew Tsuei, MD;  Location: [facility];  Service: General;  Laterality: N/A;   COLPOSCOPY  2018   FOOT SURGERY Left child 4th grade   correct arch   TUBAL LIGATION Bilateral 2010    Family History  Problem Relation Age of Onset   Hyperlipidemia Mother    Hypertension Mother    Diabetes Mother    Diverticulitis Mother    Pancreatic cancer Father     Allergies  Allergen Reactions   Banana Anaphylaxis    "lump in throat"   Latex Itching   Other  Walnuts cause itchy throat   Tomato Hives and Swelling    Current Medications:   Current Outpatient Medications:    Acetaminophen (TYLENOL 8 HOUR PO), Take by mouth., Disp: , Rfl:    albuterol (PROAIR HFA) 108 (90 Base) MCG/ACT inhaler, Inhale 2 puffs into the lungs every 6 (six) hours as needed for wheezing or shortness of breath., Disp: 1 each, Rfl: 0   amoxicillin-clavulanate (AUGMENTIN) 875-125 MG tablet, Take 1 tablet by mouth 2 (two) times daily., Disp: 14 tablet, Rfl: 0   diphenhydrAMINE (BENADRYL) 12.5 MG/5ML liquid, Take 12.5 mg by mouth daily in the afternoon., Disp: , Rfl:    ELDERBERRY PO, Take by mouth., Disp: , Rfl:    fluticasone (FLONASE ALLERGY RELIEF) 50 MCG/ACT nasal spray, Place 2 sprays into both nostrils  daily., Disp: , Rfl:    guaiFENesin (MUCINEX) 600 MG 12 hr tablet, Take 600 mg by mouth at bedtime., Disp: , Rfl:    hydrocortisone (ANUSOL-HC) 2.5 % rectal cream, Place rectally 2 (two) times daily. Use for 7-10 days, Disp: 30 g, Rfl: 0   IBUPROFEN PO, Take by mouth., Disp: , Rfl:    meloxicam (MOBIC) 15 MG tablet, TAKE 1 TABLET(15 MG) BY MOUTH DAILY, Disp: 30 tablet, Rfl: 2   norethindrone-ethinyl estradiol-FE (LOESTRIN FE) 1-20 MG-MCG tablet, Take 1 tablet by mouth daily., Disp: 84 tablet, Rfl: 0   Semaglutide-Weight Management (WEGOVY) 0.25 MG/0.5ML SOAJ, Inject 0.25 mg into the skin once a week., Disp: 2 mL, Rfl: 1   TURMERIC PO, Take by mouth., Disp: , Rfl:    Review of Systems:   ROS Negative unless otherwise specified per HPI.   Vitals:   Vitals:   11/07/21 0828  BP: 130/80  Pulse: 96  Temp: (!) 97.5 F (36.4 C)  TempSrc: Temporal  SpO2: 99%  Weight: 256 lb 8 oz (116.3 kg)  Height: 5\' 4"  (1.626 m)     Body mass index is 44.03 kg/m.  Physical Exam:   Physical Exam Vitals and nursing note reviewed.  Constitutional:      General: She is not in acute distress.    Appearance: She is well-developed. She is not ill-appearing or toxic-appearing.  HENT:     Right Ear: Tympanic membrane is erythematous.     Left Ear: Tympanic membrane is not scarred, perforated or erythematous.  Cardiovascular:     Rate and Rhythm: Normal rate and regular rhythm.     Pulses: Normal pulses.     Heart sounds: Normal heart sounds, S1 normal and S2 normal.  Pulmonary:     Effort: Pulmonary effort is normal.     Breath sounds: Normal breath sounds.  Skin:    General: Skin is warm and dry.  Neurological:     Mental Status: She is alert.     GCS: GCS eye subscore is 4. GCS verbal subscore is 5. GCS motor subscore is 6.  Psychiatric:        Speech: Speech normal.        Behavior: Behavior normal. Behavior is cooperative.   Neck circumference is 39.5 cm  Assessment and Plan:   Sinus  congestion Will treat with oral augmentin Due to chronicity, will obtain CT scan of sinuses Low threshold to refer to ENT if any abnormalities or any worsening of symptoms  Obesity, unspecified classification, unspecified obesity type, unspecified whether serious comorbidity present Ongoing Will trial Wegovy if insurance allows -- denies hx of pancreatitis, thyroid cancer in family Discussed that this medication is not  safe for pregnancy Follow-up in 4-6 weeks, sooner if concerns  Snores Reviewed STOP-BANG score -- hers is currently 2 I have provided letter to employer stating that she is currently low risk for moderate/severe apnea  Constipation, unspecified constipation type No red flags Recommend 100-200 mg colace and miralax daily Follow-up as needed   I,Savera Zaman,acting as a scribe for [employer], PA.,have documented all relevant documentation on the behalf of Jarold Motto, PA,as directed by  Jarold Motto, PA while in the presence of Jarold Motto, [place].   I, Jarold Motto, [place], have reviewed all documentation for this visit. The documentation on 11/07/21 for the exam, diagnosis, procedures, and orders are all accurate and complete.   Jarold Motto, PA-C

## 2021-11-07 NOTE — Telephone Encounter (Signed)
Patient stated she would have to pay a large amount of $ for wegovy - patient asked if we can call in Phentermine (generic of wegovy) to see how much she would have to pay -

## 2021-11-07 NOTE — Patient Instructions (Signed)
It was great to see you!  We will update your blood work today  Start Augmentin for your sinus infection  We are going to order a CT scan of your sinuses -- if you are not contacted about scheduling in about a week, let me know  To treat your constipation today: -Take 100 mg of docusate sodium (also known as Colace, however generic is fine!) by mouth twice daily to soften stools -Drink at least 64 oz of water daily -Add in 1 capful of polyethylene glycol (also known as Miralax, however generic is fine!) to beverage of choice daily  See letter for sleep apnea  May start Wegovy if this is approved by your insurance  Let's follow-up in 1-2 months, sooner if you have concerns.  If a referral was placed today, you will be contacted for an appointment. Please note that routine referrals can sometimes take up to 3-4 weeks to process. Please call our office if you haven't heard anything after this time frame.  Take care,  Jarold Motto PA-C

## 2021-11-08 ENCOUNTER — Telehealth: Payer: Self-pay | Admitting: *Deleted

## 2021-11-08 DIAGNOSIS — E669 Obesity, unspecified: Secondary | ICD-10-CM

## 2021-11-08 NOTE — Telephone Encounter (Signed)
Julia Schultz pt wants to know if you can recommend anything else for pt to try for weight loss since Mancel Parsons is not covered by her insurance.

## 2021-11-08 NOTE — Telephone Encounter (Signed)
Spoke to pt told her received fax from pharmacy stating Julia Schultz is not covered by insurance. Pt verbalized understanding and wanted to know if there was anything else she could try? Told her will send message to [facility] to see what she recommends. Pt verbalized understanding.

## 2021-11-09 NOTE — Telephone Encounter (Signed)
Pt called back told her Lelon Mast said Options include referral to a dietitian and/or referral to Medical Weight Management. Pt verbalized understanding and would like to proceed with Medical Weight Management referral. Told her okay will place referral and someone will contact you to schedule an appt. Pt verbalized understanding.

## 2021-11-09 NOTE — Telephone Encounter (Signed)
Left message on voicemail to call office.  

## 2021-11-10 NOTE — Addendum Note (Signed)
Addended by: Jimmye Norman on: 11/10/2021 11:22 AM   Modules accepted: Orders

## 2021-11-10 NOTE — Telephone Encounter (Signed)
Orders placed in Epic for Medical Weight Management.

## 2021-11-22 ENCOUNTER — Encounter: Payer: Self-pay | Admitting: Physician Assistant

## 2021-11-30 ENCOUNTER — Other Ambulatory Visit: Payer: BC Managed Care – PPO

## 2021-12-15 NOTE — Progress Notes (Signed)
Julia Schultz is a 41 y.o. female here for a {New prob or follow up:31724}.  SCRIBE STATEMENT  History of Present Illness:   No chief complaint on file.   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   Anemia    Complication of anesthesia    woke as a child during procedure   GERD (gastroesophageal reflux disease)    Headache    migraines   Menorrhagia    Thrombocytopenia ([facility]) 2005     Social History   Tobacco Use   Smoking status: Never   Smokeless tobacco: Never  Vaping Use   Vaping Use: Never used  Substance Use Topics   Alcohol use: Yes    Comment: occ   Drug use: No    Past Surgical History:  Procedure Laterality Date   BREAST SURGERY  2003   breast reduction    CESAREAN SECTION     x 3   CHOLECYSTECTOMY N/A 08/10/2015   Procedure: LAPAROSCOPIC CHOLECYSTECTOMY WITH INTRAOPERATIVE CHOLANGIOGRAM;  Surgeon: Manus Rudd, MD;  Location: [facility];  Service: General;  Laterality: N/A;   COLPOSCOPY  2018   FOOT SURGERY Left child 4th grade   correct arch   TUBAL LIGATION Bilateral 2010    Family History  Problem Relation Age of Onset   Hyperlipidemia Mother    Hypertension Mother    Diabetes Mother    Diverticulitis Mother    Pancreatic cancer Father     Allergies  Allergen Reactions   Banana Anaphylaxis    "lump in throat"   Latex Itching   Other     Walnuts cause itchy throat   Tomato Hives and Swelling    Current Medications:   Current Outpatient Medications:    Acetaminophen (TYLENOL 8 HOUR PO), Take by mouth., Disp: , Rfl:    albuterol (PROAIR HFA) 108 (90 Base) MCG/ACT inhaler, Inhale 2 puffs into the lungs every 6 (six) hours as needed for wheezing or shortness of breath., Disp: 1 each, Rfl: 0   amoxicillin-clavulanate (AUGMENTIN) 875-125 MG tablet, Take 1 tablet by mouth 2 (two) times daily., Disp: 14 tablet, Rfl: 0   diphenhydrAMINE (BENADRYL) 12.5 MG/5ML liquid, Take 12.5 mg by  mouth daily in the afternoon., Disp: , Rfl:    ELDERBERRY PO, Take by mouth., Disp: , Rfl:    fluticasone (FLONASE ALLERGY RELIEF) 50 MCG/ACT nasal spray, Place 2 sprays into both nostrils daily., Disp: , Rfl:    guaiFENesin (MUCINEX) 600 MG 12 hr tablet, Take 600 mg by mouth at bedtime., Disp: , Rfl:    hydrocortisone (ANUSOL-HC) 2.5 % rectal cream, Place rectally 2 (two) times daily. Use for 7-10 days, Disp: 30 g, Rfl: 0   IBUPROFEN PO, Take by mouth., Disp: , Rfl:    meloxicam (MOBIC) 15 MG tablet, TAKE 1 TABLET(15 MG) BY MOUTH DAILY, Disp: 30 tablet, Rfl: 2   norethindrone-ethinyl estradiol-FE (LOESTRIN FE) 1-20 MG-MCG tablet, Take 1 tablet by mouth daily., Disp: 84 tablet, Rfl: 0   Semaglutide-Weight Management (WEGOVY) 0.25 MG/0.5ML SOAJ, Inject 0.25 mg into the skin once a week., Disp: 2 mL, Rfl: 1   TURMERIC PO, Take by mouth., Disp: , Rfl:    Review of Systems:   ROS Negative unless otherwise specified per HPI.   Vitals:   There were no vitals filed for this visit.   There is no height or weight on file to calculate BMI.  Physical Exam:   Physical Exam  Assessment and Plan:   @DIAGLIST @    I,Julia Schultz,acting as a scribe for , PA.,have documented all relevant documentation on the behalf of [employer], PA,as directed by  Jarold Motto, PA while in the presence of Jarold Motto, Jarold Motto.   ***  [place], PA-C

## 2021-12-18 ENCOUNTER — Ambulatory Visit (INDEPENDENT_AMBULATORY_CARE_PROVIDER_SITE_OTHER): Payer: BC Managed Care – PPO | Admitting: Physician Assistant

## 2021-12-18 ENCOUNTER — Encounter: Payer: Self-pay | Admitting: Physician Assistant

## 2021-12-18 VITALS — BP 120/90 | HR 72 | Temp 98.0°F | Ht 64.0 in | Wt 257.5 lb

## 2021-12-18 DIAGNOSIS — F418 Other specified anxiety disorders: Secondary | ICD-10-CM | POA: Diagnosis not present

## 2021-12-18 DIAGNOSIS — E669 Obesity, unspecified: Secondary | ICD-10-CM

## 2021-12-18 DIAGNOSIS — R030 Elevated blood-pressure reading, without diagnosis of hypertension: Secondary | ICD-10-CM

## 2021-12-18 MED ORDER — SAXENDA 18 MG/3ML ~~LOC~~ SOPN: 0.6000 mg | PEN_INJECTOR | Freq: Every day | SUBCUTANEOUS | 3 refills | Status: DC

## 2021-12-18 NOTE — Patient Instructions (Signed)
It was great to see you!  Call [facility] to schedule your CT --> [phone]  We are going to try daily Saxenda for your weight Start 0.6 mg daily, may increase by 0.6 mg weekly to goal of 3 mg  Follow-up in 3 months, sooner if concerns.  Take care,  Jarold Motto PA-C

## 2021-12-19 ENCOUNTER — Telehealth: Payer: Self-pay | Admitting: *Deleted

## 2021-12-19 NOTE — Telephone Encounter (Signed)
Received fax from pharmacy Saxenda needs PA. PA done through Covermymeds. Awaiting determination.

## 2021-12-20 NOTE — Telephone Encounter (Signed)
Received fax from [employer] Bernie Covey was denied due to weight loss medications not covered under pharmacy benefit.Samantha notified.

## 2021-12-20 NOTE — Telephone Encounter (Signed)
Spoke to pt told her Julia Schultz has been denied by your insurance. It said weight loss medications are not covered under your pharmacy benefit. Pt verbalized understanding. Told her you can check with insurance to see if there is something they would cover. Pt verbalized understanding.

## 2021-12-25 ENCOUNTER — Other Ambulatory Visit (HOSPITAL_BASED_OUTPATIENT_CLINIC_OR_DEPARTMENT_OTHER): Payer: Self-pay | Admitting: *Deleted

## 2021-12-25 DIAGNOSIS — N912 Amenorrhea, unspecified: Secondary | ICD-10-CM

## 2021-12-25 MED ORDER — NORETHIN ACE-ETH ESTRAD-FE 1-20 MG-MCG PO TABS: 1.0000 | ORAL_TABLET | Freq: Every day | ORAL | 1 refills | Status: DC

## 2022-02-15 ENCOUNTER — Encounter (INDEPENDENT_AMBULATORY_CARE_PROVIDER_SITE_OTHER): Payer: Self-pay | Admitting: Family Medicine

## 2022-02-15 DIAGNOSIS — Z0289 Encounter for other administrative examinations: Secondary | ICD-10-CM

## 2022-02-21 ENCOUNTER — Encounter: Payer: Self-pay | Admitting: Bariatrics

## 2022-02-21 ENCOUNTER — Ambulatory Visit (INDEPENDENT_AMBULATORY_CARE_PROVIDER_SITE_OTHER): Payer: BC Managed Care – PPO | Admitting: Bariatrics

## 2022-02-21 VITALS — BP 133/95 | HR 83 | Temp 97.8°F | Ht 64.0 in | Wt 256.0 lb

## 2022-02-21 DIAGNOSIS — R5383 Other fatigue: Secondary | ICD-10-CM

## 2022-02-21 DIAGNOSIS — E538 Deficiency of other specified B group vitamins: Secondary | ICD-10-CM

## 2022-02-21 DIAGNOSIS — R030 Elevated blood-pressure reading, without diagnosis of hypertension: Secondary | ICD-10-CM | POA: Diagnosis not present

## 2022-02-21 DIAGNOSIS — R0602 Shortness of breath: Secondary | ICD-10-CM

## 2022-02-21 DIAGNOSIS — Z6841 Body Mass Index (BMI) 40.0 and over, adult: Secondary | ICD-10-CM

## 2022-02-21 DIAGNOSIS — R7303 Prediabetes: Secondary | ICD-10-CM | POA: Diagnosis not present

## 2022-02-21 DIAGNOSIS — Z1331 Encounter for screening for depression: Secondary | ICD-10-CM | POA: Diagnosis not present

## 2022-02-21 DIAGNOSIS — Z0000 Encounter for general adult medical examination without abnormal findings: Secondary | ICD-10-CM | POA: Diagnosis not present

## 2022-02-21 DIAGNOSIS — E669 Obesity, unspecified: Secondary | ICD-10-CM

## 2022-02-21 DIAGNOSIS — E559 Vitamin D deficiency, unspecified: Secondary | ICD-10-CM | POA: Diagnosis not present

## 2022-02-23 LAB — INSULIN, RANDOM: INSULIN: 21.9 u[IU]/mL (ref 2.6–24.9)

## 2022-02-23 LAB — HEMOGLOBIN A1C
Est. average glucose Bld gHb Est-mCnc: 128 mg/dL
Hgb A1c MFr Bld: 6.1 % — ABNORMAL HIGH (ref 4.8–5.6)

## 2022-02-23 LAB — COMPREHENSIVE METABOLIC PANEL
ALT: 25 IU/L (ref 0–32)
AST: 22 IU/L (ref 0–40)
Albumin/Globulin Ratio: 1.7 (ref 1.2–2.2)
Albumin: 4.3 g/dL (ref 3.9–4.9)
Alkaline Phosphatase: 70 IU/L (ref 44–121)
BUN/Creatinine Ratio: 11 (ref 9–23)
BUN: 10 mg/dL (ref 6–24)
Bilirubin Total: 0.3 mg/dL (ref 0.0–1.2)
CO2: 23 mmol/L (ref 20–29)
Calcium: 9.2 mg/dL (ref 8.7–10.2)
Chloride: 100 mmol/L (ref 96–106)
Creatinine, Ser: 0.88 mg/dL (ref 0.57–1.00)
Globulin, Total: 2.6 g/dL (ref 1.5–4.5)
Glucose: 110 mg/dL — ABNORMAL HIGH (ref 70–99)
Potassium: 4 mmol/L (ref 3.5–5.2)
Sodium: 138 mmol/L (ref 134–144)
Total Protein: 6.9 g/dL (ref 6.0–8.5)
eGFR: 85 mL/min/{1.73_m2} (ref >59)

## 2022-02-23 LAB — TSH+T4F+T3FREE
Free T4: 1.12 ng/dL (ref 0.82–1.77)
T3, Free: 3.6 pg/mL (ref 2.0–4.4)
TSH: 3.74 u[IU]/mL (ref 0.450–4.500)

## 2022-02-23 LAB — VITAMIN D 25 HYDROXY (VIT D DEFICIENCY, FRACTURES): Vit D, 25-Hydroxy: 22.6 ng/mL — ABNORMAL LOW (ref 30.0–100.0)

## 2022-02-23 LAB — VITAMIN B12: Vitamin B-12: 510 pg/mL (ref 232–1245)

## 2022-03-05 NOTE — Progress Notes (Unsigned)
Chief Complaint:   OBESITY Julia Schultz (MR# [id number]) is a 41 y.o. female who presents for evaluation and treatment of obesity and related comorbidities. Current BMI is Body mass index is 43.94 kg/m. Julia Schultz has been struggling with her weight for many years and has been unsuccessful in either losing weight, maintaining weight loss, or reaching her healthy weight goal.  Julia Schultz does like to cook sometimes. She craves sweets and salty snacks.   Julia Schultz is currently in the action stage of change and ready to dedicate time achieving and maintaining a healthier weight. Julia Schultz is interested in becoming our patient and working on intensive lifestyle modifications including (but not limited to) diet and exercise for weight loss.  Julia Schultz's habits were reviewed today and are as follows: Her family eats meals together, she thinks her family will eat healthier with her, her desired weight loss is 86 lbs, she started gaining weight after 2018, her heaviest weight ever was 260 pounds, she has significant food cravings issues, she snacks frequently in the evenings, she skips meals frequently, she is frequently drinking liquids with calories, she has problems with excessive hunger, she frequently eats larger portions than normal, and she struggles with emotional eating.  Depression Screen Julia Schultz's Food and Mood (modified PHQ-9) score was 7.     02/21/2022    8:28 AM  Depression screen PHQ 2/9  Decreased Interest 3  Down, Depressed, Hopeless 1  PHQ - 2 Score 4  Altered sleeping 0  Tired, decreased energy 1  Change in appetite 1  Feeling bad or failure about yourself  1  Trouble concentrating 0  Moving slowly or fidgety/restless 0  Suicidal thoughts 0  PHQ-9 Score 7  Difficult doing work/chores Not difficult at all   Subjective:   1. Other fatigue Julia Schultz admits to daytime somnolence and admits to waking up still tired. Patient has a history of symptoms of daytime fatigue and morning fatigue. Julia Schultz  generally gets 5 hours of sleep per night, and states that she has nightime awakenings. Snoring is present. Apneic episodes are not present. Epworth Sleepiness Score is 5.   2. SOB (shortness of breath) on exertion Julia Schultz notes increasing shortness of breath with exercising and seems to be worsening over time with weight gain. She notes getting out of breath sooner with activity than she used to. This has not gotten worse recently. Julia Schultz denies shortness of breath at rest or orthopnea.  3. Health care maintenance Given obesity.   4. Prediabetes Julia Schultz's last A1c was 5.8.  5. Vitamin D deficiency Julia Schultz is taking Vitamin D currently.   6. Vitamin B 12 deficiency Julia Schultz is not taking B12 supplement.   7. Elevated blood pressure reading Julia Schultz has no diagnosis of hypertension.   Assessment/Plan:   1. Other fatigue Julia Schultz does feel that her weight is causing her energy to be lower than it should be. Fatigue may be related to obesity, depression or many other causes. Labs will be ordered, and in the meanwhile, Julia Schultz will focus on self care including making healthy food choices, increasing physical activity and focusing on stress reduction.  - EKG 12-Lead - Comprehensive metabolic panel - [id number]  2. SOB (shortness of breath) on exertion Julia Schultz does feel that she gets out of breath more easily that she used to when she exercises. Julia Schultz's shortness of breath appears to be obesity related and exercise induced. She has agreed to work on weight loss and gradually increase exercise to treat her exercise induced shortness of  breath. Will continue to monitor closely.  - Comprehensive metabolic panel - [id number]  3. Health care maintenance We will check labs today. EKG and IC were done and reviewed with the patient today.  - Comprehensive metabolic panel  4. Prediabetes We will check labs today, and we will follow-up at [facility] next visit.   - Comprehensive metabolic panel -  Hemoglobin A1c - Insulin, random  5. Vitamin D deficiency We will check labs today, and we will follow-up at [facility] next visit.   - VITAMIN D 25 Hydroxy (Vit-D Deficiency, Fractures)  6. Vitamin B 12 deficiency We will check labs today, and we will follow-up at [facility] next visit.   - Vitamin B12  7. Elevated blood pressure reading We will continue to follow over time.   8. Depression screening Julia Schultz had a positive depression screening. Depression is commonly associated with obesity and often results in emotional eating behaviors. We will monitor this closely and work on CBT to help improve the non-hunger eating patterns. Referral to Psychology may be required if no improvement is seen as she continues in our clinic.  9. Obesity, Current BMI 44.1 Julia Schultz is currently in the action stage of change and her goal is to continue with weight loss efforts. I recommend Julia Schultz begin the structured treatment plan as follows:  She has agreed to the Category 2 Plan.  Meal planning was discussed. We reviewed with the patient 11/07/2021, CMP, lipid, CBC, Glucose. Sheet was given for breakfast options.   Exercise goals: No exercise has been prescribed at this time.   Behavioral modification strategies: increasing lean protein intake, decreasing simple carbohydrates, increasing vegetables, increasing water intake, decreasing eating out, no skipping meals, meal planning and cooking strategies, keeping healthy foods in the home, and planning for success.  She was informed of the importance of frequent follow-up visits to maximize her success with intensive lifestyle modifications for her multiple health conditions. She was informed we would discuss her lab results at her next visit unless there is a critical issue that needs to be addressed sooner. Julia Schultz agreed to keep her next visit at the agreed upon time to discuss these results.  Objective:   Blood pressure (!) 133/95, pulse 83, temperature 97.8 F  (36.6 C), height 5\' 4"  (1.626 m), weight 256 lb (116.1 kg), SpO2 100 %. Body mass index is 43.94 kg/m.  EKG: Normal sinus rhythm, rate 78 BPM.  Indirect Calorimeter completed today shows a VO2 of 257 and a REE of 1771.  Her calculated basal metabolic rate is thus her basal metabolic rate is worse than expected.  General: Cooperative, alert, well developed, in no acute distress. HEENT: Conjunctivae and lids unremarkable. Cardiovascular: Regular rhythm.  Lungs: Normal work of breathing. Neurologic: No focal deficits.   Lab Results  Component Value Date   CREATININE 0.88 02/21/2022   BUN 10 02/21/2022   NA 138 02/21/2022   K 4.0 02/21/2022   CL 100 02/21/2022   CO2 23 02/21/2022   Lab Results  Component Value Date   ALT 25 02/21/2022   AST 22 02/21/2022   ALKPHOS 70 02/21/2022   BILITOT 0.3 02/21/2022   Lab Results  Component Value Date   HGBA1C 6.1 (H) 02/21/2022   HGBA1C 5.8 (H) 06/29/2021   Lab Results  Component Value Date   INSULIN 21.9 02/21/2022   Lab Results  Component Value Date   TSH 3.740 02/21/2022   Lab Results  Component Value Date   CHOL 174 11/07/2021  HDL 53.00 11/07/2021   LDLCALC 106 (H) 11/07/2021   TRIG 77.0 11/07/2021   CHOLHDL 3 11/07/2021   Lab Results  Component Value Date   WBC 7.5 11/07/2021   HGB 13.4 11/07/2021   HCT 39.4 11/07/2021   MCV 89.7 11/07/2021   PLT 150.0 11/07/2021   Lab Results  Component Value Date   IRON 105 03/14/2016   TIBC 334 03/14/2016   FERRITIN 13 03/14/2016   Attestation Statements:   Reviewed by clinician on day of visit: allergies, medications, problem list, medical history, surgical history, family history, social history, and previous encounter notes.   Trude Mcburney, am acting as Energy manager for [employer], DO.  I have reviewed the above documentation for accuracy and completeness, and I agree with the above. Corinna Capra, DO

## 2022-03-06 ENCOUNTER — Encounter: Payer: Self-pay | Admitting: Bariatrics

## 2022-03-07 ENCOUNTER — Encounter: Payer: Self-pay | Admitting: Bariatrics

## 2022-03-07 ENCOUNTER — Ambulatory Visit (INDEPENDENT_AMBULATORY_CARE_PROVIDER_SITE_OTHER): Payer: BC Managed Care – PPO | Admitting: Bariatrics

## 2022-03-07 VITALS — BP 111/70 | HR 90 | Temp 98.0°F | Ht 64.0 in | Wt 251.0 lb

## 2022-03-07 DIAGNOSIS — E559 Vitamin D deficiency, unspecified: Secondary | ICD-10-CM | POA: Diagnosis not present

## 2022-03-07 DIAGNOSIS — Z6841 Body Mass Index (BMI) 40.0 and over, adult: Secondary | ICD-10-CM

## 2022-03-07 DIAGNOSIS — E669 Obesity, unspecified: Secondary | ICD-10-CM | POA: Diagnosis not present

## 2022-03-07 DIAGNOSIS — R7303 Prediabetes: Secondary | ICD-10-CM

## 2022-03-07 DIAGNOSIS — K5909 Other constipation: Secondary | ICD-10-CM | POA: Diagnosis not present

## 2022-03-07 MED ORDER — VITAMIN D (ERGOCALCIFEROL) 1.25 MG (50000 UNIT) PO CAPS: 50000.0000 [IU] | ORAL_CAPSULE | ORAL | 0 refills | Status: DC

## 2022-03-12 ENCOUNTER — Encounter: Payer: Self-pay | Admitting: Bariatrics

## 2022-03-12 NOTE — Progress Notes (Signed)
Chief Complaint:   OBESITY Julia Schultz is here to discuss her progress with her obesity treatment plan along with follow-up of her obesity related diagnoses. Julia Schultz is on the Category 2 Plan and states she is following her eating plan approximately 95% of the time. Julia Schultz states she is doing 0 minutes 0 times per week.  Today's visit was #: 2 Starting weight: 256 lbs Starting date: 02/21/2022 Today's weight: 251 lbs Today's date: 03/07/2022 Total lbs lost to date: 5 Total lbs lost since last in-office visit: 5  Interim History: Julia Schultz is down 5 lbs since her first visit. She is staying away from bread and trying to stay within her calorie limit.   Subjective:   1. Other constipation Julia Schultz notes constipation probably secondary to increased protein.   2. Vitamin D deficiency Julia Schultz's last Vitamin D level was 22.6.  3. Prediabetes Julia Schultz is not on medications currently. Last A1c was 6.1 and insulin level 21.9.  Assessment/Plan:   1. Other constipation Julia Schultz is to increase her water and fiber intake. She will consider miralax OTC daily or Citral with water.   2. Vitamin D deficiency Julia Schultz agreed to start prescription Vitamin D 50,000 IU every week with no refills. She will follow-up for routine testing of Vitamin D, at least 2-3 times per year to avoid over-replacement.  - Vitamin D, Ergocalciferol, (DRISDOL) 1.25 MG (50000 UNIT) CAPS capsule; Take 1 capsule (50,000 Units total) by mouth every 7 (seven) days.  Dispense: 5 capsule; Refill: 0  3. Prediabetes Julia Schultz will work on keeping all carbohydrates low (starches and sugars).   4. Obesity, Current BMI 43.2 Julia Schultz is currently in the action stage of change. As such, her goal is to continue with weight loss efforts. She has agreed to keeping a food journal and adhering to recommended goals of 1200 calories and 80 grams of protein daily.   Meal planning was discussed. Reviewed labs from 02/21/2022 with the patient, CMP, Vitamin D,  Vitamin B12, A1c, insulin, and thyroid panel. Eating Out sheet was given.   Exercise goals: No exercise has been prescribed at this time.  Behavioral modification strategies: increasing lean protein intake, decreasing simple carbohydrates, increasing vegetables, increasing water intake, decreasing eating out, no skipping meals, meal planning and cooking strategies, keeping healthy foods in the home, and planning for success.  Julia Schultz has agreed to follow-up with our clinic in 2 weeks. She was informed of the importance of frequent follow-up visits to maximize her success with intensive lifestyle modifications for her multiple health conditions.   Objective:   Blood pressure 111/70, pulse 90, temperature 98 F (36.7 C), height 5\' 4"  (1.626 m), weight 251 lb (113.9 kg), SpO2 99 %. Body mass index is 43.08 kg/m.  General: Cooperative, alert, well developed, in no acute distress. HEENT: Conjunctivae and lids unremarkable. Cardiovascular: Regular rhythm.  Lungs: Normal work of breathing. Neurologic: No focal deficits.   Lab Results  Component Value Date   CREATININE 0.88 02/21/2022   BUN 10 02/21/2022   NA 138 02/21/2022   K 4.0 02/21/2022   CL 100 02/21/2022   CO2 23 02/21/2022   Lab Results  Component Value Date   ALT 25 02/21/2022   AST 22 02/21/2022   ALKPHOS 70 02/21/2022   BILITOT 0.3 02/21/2022   Lab Results  Component Value Date   HGBA1C 6.1 (H) 02/21/2022   HGBA1C 5.8 (H) 06/29/2021   Lab Results  Component Value Date   INSULIN 21.9 02/21/2022   Lab Results  Component Value Date   TSH 3.740 02/21/2022   Lab Results  Component Value Date   CHOL 174 11/07/2021   HDL 53.00 11/07/2021   LDLCALC 106 (H) 11/07/2021   TRIG 77.0 11/07/2021   CHOLHDL 3 11/07/2021   Lab Results  Component Value Date   VD25OH 22.6 (L) 02/21/2022   VD25OH 23 (L) 05/13/2020   VD25OH 33.3 08/29/2018   Lab Results  Component Value Date   WBC 7.5 11/07/2021   HGB 13.4 11/07/2021    HCT 39.4 11/07/2021   MCV 89.7 11/07/2021   PLT 150.0 11/07/2021   Lab Results  Component Value Date   IRON 105 03/14/2016   TIBC 334 03/14/2016   FERRITIN 13 03/14/2016   Attestation Statements:   Reviewed by clinician on day of visit: allergies, medications, problem list, medical history, surgical history, family history, social history, and previous encounter notes.   Trude Mcburney, am acting as Energy manager for [employer], DO.  I have reviewed the above documentation for accuracy and completeness, and I agree with the above. Corinna Capra, DO

## 2022-03-20 ENCOUNTER — Ambulatory Visit: Payer: BC Managed Care – PPO | Admitting: Physician Assistant

## 2022-03-20 NOTE — Progress Notes (Shared)
Julia Schultz is a 41 y.o. female here for a follow up of a pre-existing problem.  History of Present Illness:   No chief complaint on file.   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   Anemia    Anxiety    Asthma    Complication of anesthesia    woke as a child during procedure   Constipation    Fatigue    Food allergy    GERD (gastroesophageal reflux disease)    Headache    migraines   Joint pain    Knee pain    Menorrhagia    Swelling of lower extremity    Thrombocytopenia (HCC) 2005   Vitamin D deficiency      Social History   Tobacco Use   Smoking status: Never   Smokeless tobacco: Never  Vaping Use   Vaping Use: Never used  Substance Use Topics   Alcohol use: Yes    Comment: occ   Drug use: No    Past Surgical History:  Procedure Laterality Date   BREAST SURGERY  2003   breast reduction    CESAREAN SECTION     x 3   CHOLECYSTECTOMY N/A 08/10/2015   Procedure: LAPAROSCOPIC CHOLECYSTECTOMY WITH INTRAOPERATIVE CHOLANGIOGRAM;  Surgeon: Manus Rudd, MD;  Location: [facility] OR;  Service: General;  Laterality: N/A;   COLPOSCOPY  2018   FOOT SURGERY Left child 4th grade   correct arch   TUBAL LIGATION Bilateral 2010    Family History  Problem Relation Age of Onset   Hyperlipidemia Mother    Hypertension Mother    Diabetes Mother    Diverticulitis Mother    Anxiety disorder Mother    Pancreatic cancer Father    Diabetes Father    High blood pressure Father    Depression Father     Allergies  Allergen Reactions   Banana Anaphylaxis    "lump in throat"   Latex Itching   Other     Walnuts cause itchy throat   Tomato Hives and Swelling    Current Medications:   Current Outpatient Medications:    Acetaminophen (TYLENOL 8 HOUR PO), Take by mouth. (Patient not taking: Reported on 02/21/2022), Disp: , Rfl:    albuterol (PROAIR HFA) 108 (90 Base) MCG/ACT inhaler, Inhale 2 puffs into the  lungs every 6 (six) hours as needed for wheezing or shortness of breath. (Patient not taking: Reported on 02/21/2022), Disp: 1 each, Rfl: 0   diphenhydrAMINE (BENADRYL) 12.5 MG/5ML liquid, Take 12.5 mg by mouth daily in the afternoon. (Patient not taking: Reported on 02/21/2022), Disp: , Rfl:    ELDERBERRY PO, Take by mouth. (Patient not taking: Reported on 02/21/2022), Disp: , Rfl:    fluticasone (FLONASE ALLERGY RELIEF) 50 MCG/ACT nasal spray, Place 2 sprays into both nostrils daily. (Patient not taking: Reported on 02/21/2022), Disp: , Rfl:    guaiFENesin (MUCINEX) 600 MG 12 hr tablet, Take 600 mg by mouth at bedtime. (Patient not taking: Reported on 02/21/2022), Disp: , Rfl:    hydrocortisone (ANUSOL-HC) 2.5 % rectal cream, Place rectally 2 (two) times daily. Use for 7-10 days (Patient not taking: Reported on 02/21/2022), Disp: 30 g, Rfl: 0   IBUPROFEN PO, Take by mouth. (Patient not taking: Reported on 02/21/2022), Disp: , Rfl:    meloxicam (MOBIC) 15 MG tablet, TAKE 1 TABLET(15 MG) BY MOUTH DAILY, Disp: 30 tablet, Rfl: 2  norethindrone-ethinyl estradiol-FE (LOESTRIN FE) 1-20 MG-MCG tablet, Take 1 tablet by mouth daily., Disp: 84 tablet, Rfl: 1   TURMERIC PO, Take by mouth., Disp: , Rfl:    Vitamin D, Ergocalciferol, (DRISDOL) 1.25 MG (50000 UNIT) CAPS capsule, Take 1 capsule (50,000 Units total) by mouth every 7 (seven) days., Disp: 5 capsule, Rfl: 0   Review of Systems:   ROS  Vitals:   There were no vitals filed for this visit.   There is no height or weight on file to calculate BMI.  Physical Exam:   Physical Exam Constitutional:      General: She is not in acute distress.    Appearance: Normal appearance. She is not ill-appearing.  HENT:     Head: Normocephalic and atraumatic.     Right Ear: External ear normal.     Left Ear: External ear normal.  Eyes:     Extraocular Movements: Extraocular movements intact.     Pupils: Pupils are equal, round, and reactive to light.   Cardiovascular:     Rate and Rhythm: Normal rate and regular rhythm.     Pulses: Normal pulses.     Heart sounds: Normal heart sounds. No murmur heard.    No gallop.  Pulmonary:     Effort: Pulmonary effort is normal. No respiratory distress.     Breath sounds: Normal breath sounds. No wheezing or rales.  Skin:    General: Skin is warm and dry.  Neurological:     Mental Status: She is alert and oriented to person, place, and time.  Psychiatric:        Judgment: Judgment normal.     Assessment and Plan:   There are no diagnoses linked to this encounter.   I,Param Shah,acting as a Education administrator for [employer], PA.,have documented all relevant documentation on the behalf of Inda Coke, PA,as directed by  Inda Coke, PA while in the presence of Inda Coke, [place].  Inda Coke, PA-C

## 2022-03-21 ENCOUNTER — Telehealth (INDEPENDENT_AMBULATORY_CARE_PROVIDER_SITE_OTHER): Payer: Self-pay | Admitting: Bariatrics

## 2022-03-21 ENCOUNTER — Encounter: Payer: Self-pay | Admitting: Bariatrics

## 2022-03-21 ENCOUNTER — Ambulatory Visit (INDEPENDENT_AMBULATORY_CARE_PROVIDER_SITE_OTHER): Payer: BC Managed Care – PPO | Admitting: Bariatrics

## 2022-03-21 ENCOUNTER — Encounter (INDEPENDENT_AMBULATORY_CARE_PROVIDER_SITE_OTHER): Payer: Self-pay

## 2022-03-21 VITALS — BP 127/86 | HR 92 | Temp 98.4°F | Ht 64.0 in | Wt 250.0 lb

## 2022-03-21 DIAGNOSIS — E559 Vitamin D deficiency, unspecified: Secondary | ICD-10-CM

## 2022-03-21 DIAGNOSIS — F5089 Other specified eating disorder: Secondary | ICD-10-CM | POA: Diagnosis not present

## 2022-03-21 DIAGNOSIS — E669 Obesity, unspecified: Secondary | ICD-10-CM

## 2022-03-21 DIAGNOSIS — Z6841 Body Mass Index (BMI) 40.0 and over, adult: Secondary | ICD-10-CM

## 2022-03-21 DIAGNOSIS — R7303 Prediabetes: Secondary | ICD-10-CM | POA: Diagnosis not present

## 2022-03-21 DIAGNOSIS — F509 Eating disorder, unspecified: Secondary | ICD-10-CM | POA: Insufficient documentation

## 2022-03-21 DIAGNOSIS — R632 Polyphagia: Secondary | ICD-10-CM

## 2022-03-21 MED ORDER — TRULICITY 0.75 MG/0.5ML ~~LOC~~ SOAJ: 0.7500 mg | SUBCUTANEOUS | 0 refills | Status: DC

## 2022-03-21 MED ORDER — VITAMIN D (ERGOCALCIFEROL) 1.25 MG (50000 UNIT) PO CAPS: 50000.0000 [IU] | ORAL_CAPSULE | ORAL | 0 refills | Status: DC

## 2022-03-21 NOTE — Telephone Encounter (Signed)
Dr. Owens Shark - Prior authorization denied for Trulicity. Per insurance: patient does not have type 2 diabetes. Product not covered for this health plan/disease state/medication as submitted. Product not covered by this plan for this diagnosis. For a FDA label approved diagnosis. Patient sent denial message via mychart.

## 2022-03-26 ENCOUNTER — Encounter: Payer: Self-pay | Admitting: Bariatrics

## 2022-03-26 NOTE — Progress Notes (Signed)
Chief Complaint:   OBESITY Julia Schultz is here to discuss her progress with her obesity treatment plan along with follow-up of her obesity related diagnoses. Julia Schultz is on keeping a food journal and adhering to recommended goals of 1200 calories and 80 grams of protein daily and states she is following her eating plan approximately 50% of the time. Julia Schultz states she is doing 0 minutes 0 times per week.  Today's visit was #: 3 Starting weight: 256 lbs Starting date: 02/21/2022 Today's weight: 250 lbs Today's date: 03/21/2022 Total lbs lost to date: 6 Total lbs lost since last in-office visit: 1  Interim History: Julia Schultz is down 1 lb since her last visit. She has struggled over the last week.   Subjective:   1. Vitamin D deficiency Julia Schultz is taking Vitamin D as directed.   2. Prediabetes Julia Schultz is not on medications currently.   3. Polyphagia Julia Schultz notes go to is to increase her food intake.   4. Other disorder of eating Julia Schultz notes increase in starches.   Assessment/Plan:   1. Vitamin D deficiency We will refill prescription Vitamin D for 1 month. Julia Schultz will follow-up for routine testing of Vitamin D, at least 2-3 times per year to avoid over-replacement.  - Vitamin D, Ergocalciferol, (DRISDOL) 1.25 MG (50000 UNIT) CAPS capsule; Take 1 capsule (50,000 Units total) by mouth every 7 (seven) days.  Dispense: 5 capsule; Refill: 0  2. Prediabetes Julia Schultz will minimize all carbohydrates (sweets and starches). She will begin Trulicity, and we discussed GLP-1's and sheet was given.   3. Polyphagia Julia Schultz agreed to start Trulicity [id number] mg once weekly, with no refills. Better snacks were discussed. She is to increase raw vegetables.   - Dulaglutide (TRULICITY) A999333 0000000 SOPN; Inject 0.75 mg into the skin once a week.  Dispense: 2 mL; Refill: 0  4. Other disorder of eating Julia Schultz will work on increasing her protein and water intake.   5. Obesity, Current BMI 43.0 Julia Schultz is currently in  the action stage of change. As such, her goal is to continue with weight loss efforts. She has agreed to the Category 2 Plan and keeping a food journal and adhering to recommended goals of 1200 calories and 80 grams of protein.   She will adhere closely to the plan. She will get a protein shake in the AM and keep her water intake high.  Exercise goals: No exercise has been prescribed at this time.  Behavioral modification strategies: increasing lean protein intake, decreasing simple carbohydrates, increasing vegetables, increasing water intake, decreasing eating out, no skipping meals, meal planning and cooking strategies, keeping healthy foods in the home, and planning for success.  Julia Schultz has agreed to follow-up with our clinic in 2 weeks. She was informed of the importance of frequent follow-up visits to maximize her success with intensive lifestyle modifications for her multiple health conditions.   Objective:   Blood pressure 127/86, pulse 92, temperature 98.4 F (36.9 C), height 5\' 4"  (1.626 m), weight 250 lb (113.4 kg), SpO2 98 %. Body mass index is 42.91 kg/m.  General: Cooperative, alert, well developed, in no acute distress. HEENT: Conjunctivae and lids unremarkable. Cardiovascular: Regular rhythm.  Lungs: Normal work of breathing. Neurologic: No focal deficits.   Lab Results  Component Value Date   CREATININE 0.88 02/21/2022   BUN 10 02/21/2022   NA 138 02/21/2022   K 4.0 02/21/2022   CL 100 02/21/2022   CO2 23 02/21/2022   Lab Results  Component Value  Date   ALT 25 02/21/2022   AST 22 02/21/2022   ALKPHOS 70 02/21/2022   BILITOT 0.3 02/21/2022   Lab Results  Component Value Date   HGBA1C 6.1 (H) 02/21/2022   HGBA1C 5.8 (H) 06/29/2021   Lab Results  Component Value Date   INSULIN 21.9 02/21/2022   Lab Results  Component Value Date   TSH 3.740 02/21/2022   Lab Results  Component Value Date   CHOL 174 11/07/2021   HDL 53.00 11/07/2021   LDLCALC 106 (H)  11/07/2021   TRIG 77.0 11/07/2021   CHOLHDL 3 11/07/2021   Lab Results  Component Value Date   VD25OH 22.6 (L) 02/21/2022   VD25OH 23 (L) 05/13/2020   VD25OH 33.3 08/29/2018   Lab Results  Component Value Date   WBC 7.5 11/07/2021   HGB 13.4 11/07/2021   HCT 39.4 11/07/2021   MCV 89.7 11/07/2021   PLT 150.0 11/07/2021   Lab Results  Component Value Date   IRON 105 03/14/2016   TIBC 334 03/14/2016   FERRITIN 13 03/14/2016   Attestation Statements:   Reviewed by clinician on day of visit: allergies, medications, problem list, medical history, surgical history, family history, social history, and previous encounter notes.   Wilhemena Durie, am acting as Location manager for [employer], DO.  I have reviewed the above documentation for accuracy and completeness, and I agree with the above. Jearld Lesch, DO

## 2022-04-04 ENCOUNTER — Ambulatory Visit (INDEPENDENT_AMBULATORY_CARE_PROVIDER_SITE_OTHER): Payer: BC Managed Care – PPO | Admitting: Bariatrics

## 2022-04-04 ENCOUNTER — Encounter: Payer: Self-pay | Admitting: Bariatrics

## 2022-04-04 VITALS — BP 132/84 | HR 77 | Temp 98.2°F | Ht 64.0 in | Wt 252.0 lb

## 2022-04-04 DIAGNOSIS — E669 Obesity, unspecified: Secondary | ICD-10-CM

## 2022-04-04 DIAGNOSIS — R632 Polyphagia: Secondary | ICD-10-CM | POA: Diagnosis not present

## 2022-04-04 DIAGNOSIS — Z6841 Body Mass Index (BMI) 40.0 and over, adult: Secondary | ICD-10-CM

## 2022-04-04 DIAGNOSIS — E559 Vitamin D deficiency, unspecified: Secondary | ICD-10-CM

## 2022-04-04 MED ORDER — VITAMIN D (ERGOCALCIFEROL) 1.25 MG (50000 UNIT) PO CAPS: 50000.0000 [IU] | ORAL_CAPSULE | ORAL | 0 refills | Status: DC

## 2022-04-04 MED ORDER — METFORMIN HCL 500 MG PO TABS: 500.0000 mg | ORAL_TABLET | Freq: Every day | ORAL | 0 refills | Status: DC

## 2022-04-09 ENCOUNTER — Other Ambulatory Visit: Payer: Self-pay | Admitting: Bariatrics

## 2022-04-09 DIAGNOSIS — E559 Vitamin D deficiency, unspecified: Secondary | ICD-10-CM

## 2022-04-11 NOTE — Progress Notes (Signed)
Chief Complaint:   OBESITY Julia Schultz is here to discuss her progress with her obesity treatment plan along with follow-up of her obesity related diagnoses. Julia Schultz is on the Category 2 Plan and keeping a food journal and adhering to recommended goals of 1200 calories and 80 grams of protein and states she is following her eating plan approximately 50 % of the time. Julia Schultz states she is doing 0 minutes 0 times per week.  Today's visit was #: 4 Starting weight: 256 lbs Starting date: 02/21/2022 Today's weight: 252 lbs Today's date: 04/04/2022 Total lbs lost to date: 4 lbs Total lbs lost since last in-office visit: 0 lbs  Interim History: Julia Schultz is up 2 lbs since her last visit. She struggles and skip meals.  Subjective:   1. Vitamin D deficiency Julia Schultz is taking prescription Vitamin D as directed.  2. Polyphagia Julia Schultz is taking prescription Trulicity as directed.  Assessment/Plan:   1. Vitamin D deficiency We will continue Vitamin D 50,000 once a week, no refills.  - Vitamin D, Ergocalciferol, (DRISDOL) 1.25 MG (50000 UNIT) CAPS capsule; Take 1 capsule (50,000 Units total) by mouth every 7 (seven) days.  Dispense: 5 capsule; Refill: 0  2. Polyphagia We will continue Metformin once a day and taking with food. No refills.  - metFORMIN (GLUCOPHAGE) 500 MG tablet; Take 1 tablet (500 mg total) by mouth daily with lunch.  Dispense: 30 tablet; Refill: 0  3. Obesity, Current BMI 43.4 Julia Schultz is currently in the action stage of change. As such, her goal is to continue with weight loss efforts. She has agreed to the Category 2 Plan and keeping a food journal and adhering to recommended goals of 1200 calories and 80 grams of protein.   Julia Schultz will have a shake or egg and cheese sticks available if she she does not eat. She will also increase her protein.  Exercise goals: Will restart and increase exercise as tolerated.  Behavioral modification strategies: increasing lean protein intake,  decreasing simple carbohydrates, increasing vegetables, increasing water intake, decreasing eating out, no skipping meals, meal planning and cooking strategies, keeping healthy foods in the home, better snacking choices, and planning for success.  Julia Schultz has agreed to follow-up with our clinic in 4 weeks. She was informed of the importance of frequent follow-up visits to maximize her success with intensive lifestyle modifications for her multiple health conditions.   Objective:   Blood pressure 132/84, pulse 77, temperature 98.2 F (36.8 C), height 5\' 4"  (1.626 m), weight 252 lb (114.3 kg), SpO2 100 %. Body mass index is 43.26 kg/m.  General: Cooperative, alert, well developed, in no acute distress. HEENT: Conjunctivae and lids unremarkable. Cardiovascular: Regular rhythm.  Lungs: Normal work of breathing. Neurologic: No focal deficits.   Lab Results  Component Value Date   CREATININE 0.88 02/21/2022   BUN 10 02/21/2022   NA 138 02/21/2022   K 4.0 02/21/2022   CL 100 02/21/2022   CO2 23 02/21/2022   Lab Results  Component Value Date   ALT 25 02/21/2022   AST 22 02/21/2022   ALKPHOS 70 02/21/2022   BILITOT 0.3 02/21/2022   Lab Results  Component Value Date   HGBA1C 6.1 (H) 02/21/2022   HGBA1C 5.8 (H) 06/29/2021   Lab Results  Component Value Date   INSULIN 21.9 02/21/2022   Lab Results  Component Value Date   TSH 3.740 02/21/2022   Lab Results  Component Value Date   CHOL 174 11/07/2021   HDL 53.00  11/07/2021   LDLCALC 106 (H) 11/07/2021   TRIG 77.0 11/07/2021   CHOLHDL 3 11/07/2021   Lab Results  Component Value Date   VD25OH 22.6 (L) 02/21/2022   VD25OH 23 (L) 05/13/2020   VD25OH 33.3 08/29/2018   Lab Results  Component Value Date   WBC 7.5 11/07/2021   HGB 13.4 11/07/2021   HCT 39.4 11/07/2021   MCV 89.7 11/07/2021   PLT 150.0 11/07/2021   Lab Results  Component Value Date   IRON 105 03/14/2016   TIBC 334 03/14/2016   FERRITIN 13 03/14/2016    Attestation Statements:   Reviewed by clinician on day of visit: allergies, medications, problem list, medical history, surgical history, family history, social history, and previous encounter notes.  Carlus Pavlov, am acting as Energy manager for [employer], DO.  I have reviewed the above documentation for accuracy and completeness, and I agree with the above. Corinna Capra, DO

## 2022-04-23 ENCOUNTER — Encounter: Payer: Self-pay | Admitting: Bariatrics

## 2022-05-02 ENCOUNTER — Ambulatory Visit: Payer: BC Managed Care – PPO | Admitting: Bariatrics

## 2022-05-08 ENCOUNTER — Encounter: Payer: Self-pay | Admitting: Bariatrics

## 2022-05-08 ENCOUNTER — Ambulatory Visit (INDEPENDENT_AMBULATORY_CARE_PROVIDER_SITE_OTHER): Payer: BC Managed Care – PPO | Admitting: Bariatrics

## 2022-05-08 VITALS — BP 129/89 | HR 83 | Temp 97.8°F | Ht 64.0 in | Wt 253.0 lb

## 2022-05-08 DIAGNOSIS — Z6841 Body Mass Index (BMI) 40.0 and over, adult: Secondary | ICD-10-CM | POA: Diagnosis not present

## 2022-05-08 DIAGNOSIS — R632 Polyphagia: Secondary | ICD-10-CM

## 2022-05-08 DIAGNOSIS — E669 Obesity, unspecified: Secondary | ICD-10-CM

## 2022-05-08 DIAGNOSIS — E559 Vitamin D deficiency, unspecified: Secondary | ICD-10-CM | POA: Diagnosis not present

## 2022-05-08 MED ORDER — METFORMIN HCL 500 MG PO TABS: 500.0000 mg | ORAL_TABLET | Freq: Two times a day (BID) | ORAL | 0 refills | Status: DC

## 2022-05-08 MED ORDER — VITAMIN D (ERGOCALCIFEROL) 1.25 MG (50000 UNIT) PO CAPS: 50000.0000 [IU] | ORAL_CAPSULE | ORAL | 0 refills | Status: DC

## 2022-05-15 NOTE — Progress Notes (Unsigned)
Chief Complaint:   OBESITY Julia Schultz is here to discuss her progress with her obesity treatment plan along with follow-up of her obesity related diagnoses. Julia Schultz is on the Category 2 Plan and keeping a food journal and adhering to recommended goals of 1200 calories and 80 grams of protein and states she is following her eating plan approximately 50% of the time. Julia Schultz states she is doing 0 minutes 0 times per week.  Today's visit was #: 5 Starting weight: 256 lbs Starting date: 02/21/2022 Today's weight: 253 lbs Today's date: 05/08/2022 Total lbs lost to date: 3 Total lbs lost since last in-office visit: 0  Interim History: Julia Schultz has had problems with meal planning and meals overall. She is up 1 lb since her last visit.   Subjective:   1. Vitamin D deficiency [name] is taking Vitamin D as directed.   2. Polyphagia [name] is taking metformin. She was prescribed Trulicity, but not covered by insurance.   Assessment/Plan:   1. Vitamin D deficiency We will refill prescription Vitamin D 50,000 IU every week for 1 month.   - Vitamin D, Ergocalciferol, (DRISDOL) 1.25 MG (50000 UNIT) CAPS capsule; Take 1 capsule (50,000 Units total) by mouth every 7 (seven) days.  Dispense: 5 capsule; Refill: 0  2. Polyphagia [name] will continue metformin 500 mg BID, and we will refill for 1 month.   - metFORMIN (GLUCOPHAGE) 500 MG tablet; Take 1 tablet (500 mg total) by mouth 2 (two) times daily with a meal.  Dispense: 60 tablet; Refill: 0  3. Obesity, Current BMI 43.5 Julia Schultz is currently in the action stage of change. As such, her goal is to continue with weight loss efforts. She has agreed to keeping a food journal and adhering to recommended goals of 1200 calories and 90 grams of protein.   Meal planning and mindful eating was discussed. Minimize sweets, starches, increase protein, increase water, and increase fiber.   Exercise goals: No exercise has been prescribed at this time.  Behavioral  modification strategies: increasing lean protein intake, decreasing simple carbohydrates, increasing vegetables, increasing water intake, decreasing eating out, no skipping meals, meal planning and cooking strategies, keeping healthy foods in the home, and planning for success.  Julia Schultz has agreed to follow-up with our clinic in 2 to 3 weeks. She was informed of the importance of frequent follow-up visits to maximize her success with intensive lifestyle modifications for her multiple health conditions.   Objective:   Blood pressure 129/89, pulse 83, temperature 97.8 F (36.6 C), height 5\' 4"  (1.626 m), weight 253 lb (114.8 kg), SpO2 99 %. Body mass index is 43.43 kg/m.  General: Cooperative, alert, well developed, in no acute distress. HEENT: Conjunctivae and lids unremarkable. Cardiovascular: Regular rhythm.  Lungs: Normal work of breathing. Neurologic: No focal deficits.   Lab Results  Component Value Date   CREATININE 0.88 02/21/2022   BUN 10 02/21/2022   NA 138 02/21/2022   K 4.0 02/21/2022   CL 100 02/21/2022   CO2 23 02/21/2022   Lab Results  Component Value Date   ALT 25 02/21/2022   AST 22 02/21/2022   ALKPHOS 70 02/21/2022   BILITOT 0.3 02/21/2022   Lab Results  Component Value Date   HGBA1C 6.1 (H) 02/21/2022   HGBA1C 5.8 (H) 06/29/2021   Lab Results  Component Value Date   INSULIN 21.9 02/21/2022   Lab Results  Component Value Date   TSH 3.740 02/21/2022   Lab Results  Component Value Date  CHOL 174 11/07/2021   HDL 53.00 11/07/2021   LDLCALC 106 (H) 11/07/2021   TRIG 77.0 11/07/2021   CHOLHDL 3 11/07/2021   Lab Results  Component Value Date   VD25OH 22.6 (L) 02/21/2022   VD25OH 23 (L) 05/13/2020   VD25OH 33.3 08/29/2018   Lab Results  Component Value Date   WBC 7.5 11/07/2021   HGB 13.4 11/07/2021   HCT 39.4 11/07/2021   MCV 89.7 11/07/2021   PLT 150.0 11/07/2021   Lab Results  Component Value Date   IRON 105 03/14/2016   TIBC 334  03/14/2016   FERRITIN 13 03/14/2016   Attestation Statements:   Reviewed by clinician on day of visit: allergies, medications, problem list, medical history, surgical history, family history, social history, and previous encounter notes.   Trude Mcburney, am acting as Energy manager for [employer], DO.  I have reviewed the above documentation for accuracy and completeness, and I agree with the above. Corinna Capra, DO

## 2022-05-16 ENCOUNTER — Encounter: Payer: Self-pay | Admitting: Bariatrics

## 2022-06-05 ENCOUNTER — Other Ambulatory Visit: Payer: Self-pay | Admitting: Bariatrics

## 2022-06-05 DIAGNOSIS — R632 Polyphagia: Secondary | ICD-10-CM

## 2022-06-09 ENCOUNTER — Other Ambulatory Visit: Payer: Self-pay | Admitting: Bariatrics

## 2022-06-09 DIAGNOSIS — E559 Vitamin D deficiency, unspecified: Secondary | ICD-10-CM

## 2022-06-13 ENCOUNTER — Other Ambulatory Visit (HOSPITAL_BASED_OUTPATIENT_CLINIC_OR_DEPARTMENT_OTHER): Payer: Self-pay | Admitting: Obstetrics & Gynecology

## 2022-06-13 DIAGNOSIS — N912 Amenorrhea, unspecified: Secondary | ICD-10-CM

## 2022-06-13 NOTE — Telephone Encounter (Signed)
LMOVM for pt to call office regarding refill request 

## 2022-07-05 ENCOUNTER — Ambulatory Visit (HOSPITAL_BASED_OUTPATIENT_CLINIC_OR_DEPARTMENT_OTHER): Payer: BC Managed Care – PPO | Admitting: Obstetrics & Gynecology

## 2023-04-18 ENCOUNTER — Other Ambulatory Visit (HOSPITAL_COMMUNITY)
Admission: RE | Admit: 2023-04-18 | Discharge: 2023-04-18 | Disposition: A | Discharge status: Home / Self Care | Payer: Medicaid Other | Source: Ambulatory Visit | Attending: Certified Nurse Midwife | Admitting: Certified Nurse Midwife

## 2023-04-18 ENCOUNTER — Ambulatory Visit (HOSPITAL_BASED_OUTPATIENT_CLINIC_OR_DEPARTMENT_OTHER): Payer: Medicaid Other | Admitting: Certified Nurse Midwife

## 2023-04-18 ENCOUNTER — Encounter (HOSPITAL_BASED_OUTPATIENT_CLINIC_OR_DEPARTMENT_OTHER): Payer: Self-pay | Admitting: Certified Nurse Midwife

## 2023-04-18 VITALS — BP 140/107 | HR 87 | Ht 64.0 in | Wt 277.8 lb

## 2023-04-18 DIAGNOSIS — N939 Abnormal uterine and vaginal bleeding, unspecified: Secondary | ICD-10-CM

## 2023-04-18 DIAGNOSIS — Z124 Encounter for screening for malignant neoplasm of cervix: Secondary | ICD-10-CM | POA: Insufficient documentation

## 2023-04-18 NOTE — Progress Notes (Signed)
Julia Schultz is a 42yo engaged [phone] (1 vaginal, 3 CS) here for problem gyn visit.   Her last annual gyn visit was 07/09/21. Normal pap smear at that time. Weight was 253lb. Her menstrual cycles were mostly regular at that time with an occasional month that she would skip. Today the patient states she may not have had a period in 7 months to 1 year. She is unsure when her last menstrual period was but doesn't recall one in 2024. She has been under a great deal of emotional stress lately. About a week ago she started spotting all day every day and this has persisted (? Day 7 or 8 now). The bleeding doesn't feel to her like a normal period (spotting). Contraception: BTL.  She is not on COC at this time and would rather not be, although COC did help to regulate her in the past.    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 (Patient not taking: Reported on 02/21/2022)     albuterol (PROAIR HFA) 108 (90 Base) MCG/ACT inhaler Inhale 2 puffs into the lungs every 6 (six) hours as needed for wheezing or shortness of breath. (Patient not taking: Reported on 02/21/2022) 1 each 0   diphenhydrAMINE (BENADRYL) 12.5 MG/5ML liquid Take 12.5 mg by mouth daily in the afternoon. (Patient not taking: Reported on 02/21/2022)     Dulaglutide (TRULICITY) 0.75 MG/0.5ML SOPN Inject 0.75 mg into the skin once a week. 2 mL 0   ELDERBERRY PO Take by mouth.     fluticasone (FLONASE ALLERGY  RELIEF) 50 MCG/ACT nasal spray Place 2 sprays into both nostrils daily.     guaiFENesin (MUCINEX) 600 MG 12 hr tablet Take 600 mg by mouth at bedtime.     hydrocortisone (ANUSOL-HC) 2.5 % rectal cream Place rectally 2 (two) times daily. Use for 7-10 days 30 g 0   IBUPROFEN PO Take by mouth.     meloxicam (MOBIC) 15 MG tablet TAKE 1 TABLET(15 MG) BY MOUTH DAILY 30 tablet 2   metFORMIN (GLUCOPHAGE) 500 MG tablet Take 1 tablet (500 mg total) by mouth 2 (two) times daily with a meal. 60 tablet 0   norethindrone-ethinyl estradiol-FE (BLISOVI FE 1/20) 1-20 MG-MCG tablet TAKE 1 TABLET BY MOUTH DAILY 84 tablet 0   TURMERIC PO Take by mouth.     Vitamin D, Ergocalciferol, (DRISDOL) 1.25 MG (50000 UNIT) CAPS capsule Take 1 capsule (50,000 Units total) by mouth every 7 (seven) days. 5 capsule 0   No current facility-administered medications on file prior to visit.    ALLERGIES: Banana, Latex, Other, and Tomato  SH:  lives with 3 of her 4 children. Corliss Parish is 57, daughter 9, 2 older sons. Boyfriend is very involved and supportive. 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 Has been under a great deal of stress lately and has been for years since separating from husband and raising 4 children on her own. Youngest child is joining band.   ROS  PHYSICAL EXAMINATION:    BP (!) 140/107 (BP Location: Right Arm, Patient Position: Sitting, Cuff Size: Large)   Pulse 87   Ht 5\' 4"  (1.626 m)   Wt 277 lb 12.8 oz (126 kg)   LMP 04/07/2023 (Approximate)   BMI 47.68 kg/m     General appearance: alert, cooperative and appears stated age, BMI 19  Pelvic: External genitalia:  no lesions              Urethra:  normal appearing urethra with no masses, tenderness or lesions              Bartholins and Skenes: normal                 Vagina: normal without tenderness, induration or masses              Cervix: no bleeding following Pap, no lesions, and nulliparous appearance              Bimanual Exam:  Uterus:   unable  to palpate uterus due to body habitus              Adnexa: unable to assess  Chaperone, Hendricks Milo, CMA, was present for exam.  Assessment/Plan: 1. Abnormal uterine bleeding (AUB) - Discussed options for management and pt gave verbal and written consent for endometrial biopsy which was collected.  - RTO for Pelvic US (AUB) 04/24/23 - Surgical pathology( [place]/ [facility])  2. Cervical cancer screening - Cytology - PAP( [place])  Follow-up with patient after GYN Ultrasound.Letta Kocher

## 2023-04-22 LAB — CYTOLOGY - PAP
Adequacy: ABSENT
Chlamydia: NEGATIVE
Comment: NEGATIVE
Comment: NEGATIVE
Comment: NEGATIVE
Comment: NORMAL
Diagnosis: NEGATIVE
High risk HPV: POSITIVE — AB
Neisseria Gonorrhea: NEGATIVE
Trichomonas: NEGATIVE

## 2023-04-22 LAB — SURGICAL PATHOLOGY

## 2023-04-24 ENCOUNTER — Other Ambulatory Visit (HOSPITAL_BASED_OUTPATIENT_CLINIC_OR_DEPARTMENT_OTHER): Payer: Self-pay | Admitting: Certified Nurse Midwife

## 2023-04-24 ENCOUNTER — Ambulatory Visit (HOSPITAL_BASED_OUTPATIENT_CLINIC_OR_DEPARTMENT_OTHER): Payer: Medicaid Other

## 2023-04-24 ENCOUNTER — Ambulatory Visit (HOSPITAL_BASED_OUTPATIENT_CLINIC_OR_DEPARTMENT_OTHER): Payer: Medicaid Other | Admitting: Certified Nurse Midwife

## 2023-04-24 ENCOUNTER — Encounter (HOSPITAL_BASED_OUTPATIENT_CLINIC_OR_DEPARTMENT_OTHER): Payer: Self-pay | Admitting: Certified Nurse Midwife

## 2023-04-24 VITALS — BP 140/98 | HR 103 | Ht 64.0 in | Wt 276.0 lb

## 2023-04-24 DIAGNOSIS — I10 Essential (primary) hypertension: Secondary | ICD-10-CM | POA: Insufficient documentation

## 2023-04-24 DIAGNOSIS — R7989 Other specified abnormal findings of blood chemistry: Secondary | ICD-10-CM | POA: Diagnosis not present

## 2023-04-24 DIAGNOSIS — N939 Abnormal uterine and vaginal bleeding, unspecified: Secondary | ICD-10-CM

## 2023-04-24 DIAGNOSIS — E282 Polycystic ovarian syndrome: Secondary | ICD-10-CM | POA: Diagnosis not present

## 2023-04-24 DIAGNOSIS — Z6841 Body Mass Index (BMI) 40.0 and over, adult: Secondary | ICD-10-CM | POA: Diagnosis not present

## 2023-04-24 DIAGNOSIS — L680 Hirsutism: Secondary | ICD-10-CM | POA: Diagnosis not present

## 2023-04-24 MED ORDER — METFORMIN HCL ER 500 MG PO TB24: 500.0000 mg | ORAL_TABLET | Freq: Every day | ORAL | 1 refills | Status: DC

## 2023-04-24 MED ORDER — VITAMIN D (ERGOCALCIFEROL) 1.25 MG (50000 UNIT) PO CAPS: 50000.0000 [IU] | ORAL_CAPSULE | ORAL | 12 refills | Status: DC

## 2023-04-24 NOTE — Progress Notes (Signed)
Marianna is a 42yo G4P4 here for [place]. She was evaluated in our office 04/18/23 due to prolonged period. Endometrial biopsy result negative. Pap smear was NILM with positive High Risk HPV. Patient was asked to return for follow-up US to assess endometrial lining.   [place] today: Small uterine fibroid  Patient agrees to schedule follow-up exam with Jarold Motto. Will collect routine labwork today so results will be available to Ms. Worley at annual physical exam (due November 2024). Pt will discuss blood pressure with Dr. Bufford Buttner.  Patient was evaluated by [facility] 03/21/22 and was prescribed Metformin 500mg  po BID. Patient states there were issues with insurance coverage and she was not able to start Metformin. Her most appointment there was 05/08/22. She was started on Vitamin D 50,000iu once weekly.    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 (Patient not taking: Reported on 02/21/2022)     albuterol (PROAIR HFA) 108 (90 Base) MCG/ACT inhaler Inhale 2 puffs into the lungs every 6 (six) hours as needed for wheezing or shortness of breath. (Patient not taking: Reported on 02/21/2022) 1 each 0   diphenhydrAMINE (BENADRYL) 12.5 MG/5ML liquid Take 12.5 mg by mouth daily in the afternoon. (Patient not taking: Reported on 02/21/2022)     Dulaglutide (TRULICITY) 0.75 MG/0.5ML SOPN Inject 0.75 mg into the skin once a week. 2 mL 0    ELDERBERRY PO Take by mouth.     fluticasone (FLONASE ALLERGY RELIEF) 50 MCG/ACT nasal spray Place 2 sprays into both nostrils daily.     guaiFENesin (MUCINEX) 600 MG 12 hr tablet Take 600 mg by mouth at bedtime.     hydrocortisone (ANUSOL-HC) 2.5 % rectal cream Place rectally 2 (two) times daily. Use for 7-10 days 30 g 0   IBUPROFEN PO Take by mouth.     meloxicam (MOBIC) 15 MG tablet TAKE 1 TABLET(15 MG) BY MOUTH DAILY 30 tablet 2   norethindrone-ethinyl estradiol-FE (BLISOVI FE 1/20) 1-20 MG-MCG tablet TAKE 1 TABLET BY MOUTH DAILY 84 tablet 0   TURMERIC PO Take by mouth.     Vitamin D, Ergocalciferol, (DRISDOL) 1.25 MG (50000 UNIT) CAPS capsule Take 1 capsule (50,000 Units total) by mouth every 7 (seven) days. 5 capsule 0   No current facility-administered medications on file prior to visit.    ALLERGIES: Banana, Latex, Other, and Tomato  PHYSICAL EXAMINATION:    BP (!) 140/98 (BP Location: Right Arm, Patient Position: Sitting, Cuff Size: Normal)   Pulse (!) 103   Ht 5\' 4"  (1.626 m)   Wt 276 lb (125.2 kg)   LMP 04/07/2023 (Approximate)   BMI 47.38 kg/m     General appearance: alert, cooperative and appears stated age  Assessment/Plan: 1. Hirsutism - Testosterone level  2. BMI 45.0-49.9, adult (HCC) - CBC - Comp Met (CMET) - TSH Rfx on Abnormal to Free T4 - B12 - Hemoglobin A1c - VITAMIN D 25 Hydroxy (Vit-D Deficiency, Fractures) - Testosterone - metFORMIN (GLUCOPHAGE-XR) 500 MG 24 hr tablet; Take 1 tablet (500 mg total) by mouth daily with breakfast.  Dispense: 30 tablet; Refill: 1  3. PCOS (polycystic ovarian syndrome) - metFORMIN (GLUCOPHAGE-XR) 500 MG 24 hr tablet; Take 1 tablet (500 mg total) by mouth daily with breakfast.  Dispense: 30 tablet; Refill: 1  4. Low vitamin D level - Vitamin D, Ergocalciferol, (DRISDOL) 1.25 MG (50000 UNIT) CAPS capsule; Take 1 capsule (50,000 Units total) by mouth every 7 (seven) days.  Dispense: 4 capsule; Refill: 12  5.  Hypertension - Pt instructed to schedule appointment with Primary Care Provider to discuss hypertension  Next annual gyn exam due November 2025.  Letta Kocher

## 2023-04-25 LAB — COMPREHENSIVE METABOLIC PANEL
ALT: 27 [IU]/L (ref 0–32)
AST: 24 [IU]/L (ref 0–40)
Albumin: 4.7 g/dL (ref 3.9–4.9)
Alkaline Phosphatase: 95 [IU]/L (ref 44–121)
BUN/Creatinine Ratio: 12 (ref 9–23)
BUN: 12 mg/dL (ref 6–24)
Bilirubin Total: 0.4 mg/dL (ref 0.0–1.2)
CO2: 23 mmol/L (ref 20–29)
Calcium: 9.4 mg/dL (ref 8.7–10.2)
Chloride: 102 mmol/L (ref 96–106)
Creatinine, Ser: 1.03 mg/dL — ABNORMAL HIGH (ref 0.57–1.00)
Globulin, Total: 2.5 g/dL (ref 1.5–4.5)
Glucose: 102 mg/dL — ABNORMAL HIGH (ref 70–99)
Potassium: 4.3 mmol/L (ref 3.5–5.2)
Sodium: 141 mmol/L (ref 134–144)
Total Protein: 7.2 g/dL (ref 6.0–8.5)
eGFR: 70 mL/min/{1.73_m2} (ref >59)

## 2023-04-25 LAB — CBC
Hematocrit: 41.4 % (ref 34.0–46.6)
Hemoglobin: 13.7 g/dL (ref 11.1–15.9)
MCH: 29.8 pg (ref 26.6–33.0)
MCHC: 33.1 g/dL (ref 31.5–35.7)
MCV: 90 fL (ref 79–97)
Platelets: 179 10*3/uL (ref 150–450)
RBC: 4.6 x10E6/uL (ref 3.77–5.28)
RDW: 13.6 % (ref 11.7–15.4)
WBC: 4.5 10*3/uL (ref 3.4–10.8)

## 2023-04-25 LAB — VITAMIN D 25 HYDROXY (VIT D DEFICIENCY, FRACTURES): Vit D, 25-Hydroxy: 33.6 ng/mL (ref 30.0–100.0)

## 2023-04-25 LAB — HEMOGLOBIN A1C
Est. average glucose Bld gHb Est-mCnc: 131 mg/dL
Hgb A1c MFr Bld: 6.2 % — ABNORMAL HIGH (ref 4.8–5.6)

## 2023-04-25 LAB — TESTOSTERONE: Testosterone: 21 ng/dL (ref 4–50)

## 2023-04-25 LAB — TSH RFX ON ABNORMAL TO FREE T4: TSH: 1.07 u[IU]/mL (ref 0.450–4.500)

## 2023-04-25 LAB — VITAMIN B12: Vitamin B-12: 462 pg/mL (ref 232–1245)

## 2023-04-29 ENCOUNTER — Encounter (HOSPITAL_BASED_OUTPATIENT_CLINIC_OR_DEPARTMENT_OTHER): Payer: Self-pay | Admitting: Emergency Medicine

## 2023-04-29 ENCOUNTER — Emergency Department (HOSPITAL_BASED_OUTPATIENT_CLINIC_OR_DEPARTMENT_OTHER)
Admission: EM | Admit: 2023-04-29 | Discharge: 2023-04-30 | Disposition: A | Discharge status: Home / Self Care | Payer: Medicaid Other | Attending: Emergency Medicine | Admitting: Emergency Medicine

## 2023-04-29 ENCOUNTER — Other Ambulatory Visit: Payer: Self-pay

## 2023-04-29 DIAGNOSIS — R519 Headache, unspecified: Secondary | ICD-10-CM | POA: Diagnosis present

## 2023-04-29 DIAGNOSIS — H53149 Visual discomfort, unspecified: Secondary | ICD-10-CM | POA: Diagnosis not present

## 2023-04-29 DIAGNOSIS — R110 Nausea: Secondary | ICD-10-CM | POA: Diagnosis not present

## 2023-04-29 DIAGNOSIS — Z91040 Latex allergy status: Secondary | ICD-10-CM | POA: Diagnosis not present

## 2023-04-29 DIAGNOSIS — Z7984 Long term (current) use of oral hypoglycemic drugs: Secondary | ICD-10-CM | POA: Diagnosis not present

## 2023-04-29 LAB — CBC WITH DIFFERENTIAL/PLATELET
Abs Immature Granulocytes: 0.02 10*3/uL (ref 0.00–0.07)
Basophils Absolute: 0 10*3/uL (ref 0.0–0.1)
Basophils Relative: 1 %
Eosinophils Absolute: 0.3 10*3/uL (ref 0.0–0.5)
Eosinophils Relative: 5 %
HCT: 39.2 % (ref 36.0–46.0)
Hemoglobin: 12.9 g/dL (ref 12.0–15.0)
Immature Granulocytes: 0 %
Lymphocytes Relative: 35 %
Lymphs Abs: 1.9 10*3/uL (ref 0.7–4.0)
MCH: 29.5 pg (ref 26.0–34.0)
MCHC: 32.9 g/dL (ref 30.0–36.0)
MCV: 89.7 fL (ref 80.0–100.0)
Monocytes Absolute: 0.4 10*3/uL (ref 0.1–1.0)
Monocytes Relative: 7 %
Neutro Abs: 2.9 10*3/uL (ref 1.7–7.7)
Neutrophils Relative %: 52 %
Platelets: 161 10*3/uL (ref 150–400)
RBC: 4.37 MIL/uL (ref 3.87–5.11)
RDW: 13.8 % (ref 11.5–15.5)
WBC: 5.5 10*3/uL (ref 4.0–10.5)
nRBC: 0 % (ref 0.0–0.2)

## 2023-04-29 NOTE — ED Notes (Signed)
Unable to obtain labs, patient difficult stick.

## 2023-04-29 NOTE — ED Triage Notes (Signed)
Patient c/o hypertension and headache, left ear pain, left jaw pain since this morning, worsening over the day.

## 2023-04-30 ENCOUNTER — Emergency Department (HOSPITAL_BASED_OUTPATIENT_CLINIC_OR_DEPARTMENT_OTHER): Payer: Medicaid Other

## 2023-04-30 LAB — COMPREHENSIVE METABOLIC PANEL
ALT: 26 U/L (ref 0–44)
AST: 29 U/L (ref 15–41)
Albumin: 4.4 g/dL (ref 3.5–5.0)
Alkaline Phosphatase: 74 U/L (ref 38–126)
Anion gap: 8 (ref 5–15)
BUN: 16 mg/dL (ref 6–20)
CO2: 26 mmol/L (ref 22–32)
Calcium: 9 mg/dL (ref 8.9–10.3)
Chloride: 103 mmol/L (ref 98–111)
Creatinine, Ser: 0.94 mg/dL (ref 0.44–1.00)
GFR, Estimated: >60 mL/min (ref >60)
Glucose, Bld: 105 mg/dL — ABNORMAL HIGH (ref 70–99)
Potassium: 4.5 mmol/L (ref 3.5–5.1)
Sodium: 137 mmol/L (ref 135–145)
Total Bilirubin: 0.8 mg/dL (ref <1.2)
Total Protein: 7.4 g/dL (ref 6.5–8.1)

## 2023-04-30 LAB — TROPONIN I (HIGH SENSITIVITY)
Troponin I (High Sensitivity): 3 ng/L (ref <18)
Troponin I (High Sensitivity): 4 ng/L (ref <18)

## 2023-04-30 LAB — PREGNANCY, URINE: Preg Test, Ur: NEGATIVE

## 2023-04-30 MED ORDER — METOCLOPRAMIDE HCL 5 MG/ML IJ SOLN
10.0000 mg | Freq: Once | INTRAMUSCULAR | Status: AC
  Administered 2023-04-30: 10 mg via INTRAVENOUS
  Filled 2023-04-30: qty 2

## 2023-04-30 MED ORDER — TETRACAINE HCL 0.5 % OP SOLN
2.0000 [drp] | Freq: Once | OPHTHALMIC | Status: AC
  Administered 2023-04-30: 2 [drp] via OPHTHALMIC
  Filled 2023-04-30: qty 4

## 2023-04-30 MED ORDER — KETOROLAC TROMETHAMINE 30 MG/ML IJ SOLN
15.0000 mg | Freq: Once | INTRAMUSCULAR | Status: AC
  Administered 2023-04-30: 15 mg via INTRAVENOUS
  Filled 2023-04-30: qty 1

## 2023-04-30 MED ORDER — FLUORESCEIN SODIUM 1 MG OP STRP
1.0000 | ORAL_STRIP | Freq: Once | OPHTHALMIC | Status: AC
  Administered 2023-04-30: 1 via OPHTHALMIC
  Filled 2023-04-30: qty 1

## 2023-04-30 MED ORDER — DIPHENHYDRAMINE HCL 50 MG/ML IJ SOLN
25.0000 mg | Freq: Once | INTRAMUSCULAR | Status: AC
  Administered 2023-04-30: 25 mg via INTRAVENOUS
  Filled 2023-04-30: qty 1

## 2023-04-30 MED ORDER — IOHEXOL 350 MG/ML SOLN
75.0000 mL | Freq: Once | INTRAVENOUS | Status: AC | PRN
  Administered 2023-04-30: 75 mL via INTRAVENOUS

## 2023-04-30 NOTE — ED Notes (Signed)
Pt ambulatory to restroom with independent steady gait °

## 2023-04-30 NOTE — ED Provider Notes (Signed)
[place] [facility] AT [facility] [place] Provider Note   CSN: [id number] Arrival date & time: 04/29/23  2249     History  No chief complaint on file.   Julia Schultz is a 42 y.o. female.  Patient with a history of anemia, recently diagnosed uterine fibroids,, cytopenia presents with a headache pain.  States she went to bed normally on Sunday but woke up the morning of Monday, November 18 with a headache and "feels like it had a few drinks" but she did not.  Headache was persistent all day.  Associated with nausea, photophobia, eye pain.  Does not normally get headaches.  Went to [employer] to check her blood pressure and found it to be 183/113.  Does not have a history of hypertension has not taken her blood pressure medications.  Became concerned and came to the ED.  Reports pain is behind her left eye that radiates to her left ear and jaw area.  No chest pain or shortness of breath.  No focal weakness, numbness or tingling.  No dizziness or lightheadedness.  No difficulty speaking or difficulty swallowing. No blood thinner use.  No history of migraine headaches  The history is provided by the patient and the spouse.       Home Medications Prior to Admission medications   Medication Sig Start Date End Date Taking? Authorizing Provider  Acetaminophen (TYLENOL 8 HOUR PO) Take by mouth. Patient not taking: Reported on 02/21/2022    [provider]  albuterol (PROAIR HFA) 108 (90 Base) MCG/ACT inhaler Inhale 2 puffs into the lungs every 6 (six) hours as needed for wheezing or shortness of breath. Patient not taking: Reported on 02/21/2022 04/26/20   Terressa Koyanagi, DO  diphenhydrAMINE (BENADRYL) 12.5 MG/5ML liquid Take 12.5 mg by mouth daily in the afternoon. Patient not taking: Reported on 02/21/2022    [provider]  Dulaglutide (TRULICITY) 0.75 MG/0.5ML SOPN Inject 0.75 mg into the skin once a week. 03/21/22   Corinna Capra A, DO  ELDERBERRY PO Take by  mouth.    [provider]  fluticasone (FLONASE ALLERGY RELIEF) 50 MCG/ACT nasal spray Place 2 sprays into both nostrils daily.    [provider]  guaiFENesin (MUCINEX) 600 MG 12 hr tablet Take 600 mg by mouth at bedtime.    [provider]  hydrocortisone (ANUSOL-HC) 2.5 % rectal cream Place rectally 2 (two) times daily. Use for 7-10 days 06/29/21   Jerene Bears, MD  IBUPROFEN PO Take by mouth.    [provider]  meloxicam (MOBIC) 15 MG tablet TAKE 1 TABLET(15 MG) BY MOUTH DAILY 06/20/20   Magnant, Joycie Peek, PA-C  metFORMIN (GLUCOPHAGE-XR) 500 MG 24 hr tablet Take 1 tablet (500 mg total) by mouth daily with breakfast. 04/24/23   Lo, Toma Aran, CNM  norethindrone-ethinyl estradiol-FE (BLISOVI FE 1/20) 1-20 MG-MCG tablet TAKE 1 TABLET BY MOUTH DAILY 06/14/22   Jerene Bears, MD  TURMERIC PO Take by mouth.    [provider]  Vitamin D, Ergocalciferol, (DRISDOL) 1.25 MG (50000 UNIT) CAPS capsule Take 1 capsule (50,000 Units total) by mouth every 7 (seven) days. 05/08/22   Roswell Nickel, DO  Vitamin D, Ergocalciferol, (DRISDOL) 1.25 MG (50000 UNIT) CAPS capsule Take 1 capsule (50,000 Units total) by mouth every 7 (seven) days. 04/24/23   Letta Kocher, CNM      Allergies    Banana, Latex, Other, and Tomato    Review of Systems  Review of Systems  Constitutional:  Negative for activity change, appetite change and fever.  HENT:  Negative for congestion and rhinorrhea.   Eyes:  Positive for photophobia, pain and visual disturbance.  Respiratory:  Negative for cough, chest tightness and shortness of breath.   Cardiovascular:  Negative for chest pain.  Gastrointestinal:  Positive for nausea. Negative for abdominal pain and vomiting.  Genitourinary:  Negative for dysuria and hematuria.  Musculoskeletal:  Negative for arthralgias and back pain.  Skin:  Negative for wound.  Neurological:  Positive for headaches. Negative for dizziness, weakness and  light-headedness.   all other systems are negative except as noted in the HPI and PMH.    Physical Exam Updated Vital Signs BP (!) 154/98   Pulse (!) 59   Temp 98.8 F (37.1 C)   Resp 16   Ht 5\' 4"  (1.626 m)   Wt 123.8 kg   LMP 04/07/2023 (Approximate)   SpO2 99%   BMI 46.86 kg/m  Physical Exam Vitals and nursing note reviewed.  Constitutional:      General: She is not in acute distress.    Appearance: She is well-developed.  HENT:     Head: Normocephalic and atraumatic.     Comments: No temporal artery tenderness    Mouth/Throat:     Pharynx: No oropharyngeal exudate.  Eyes:     Conjunctiva/sclera: Conjunctivae normal.     Pupils: Pupils are equal, round, and reactive to light.     Comments: No areas of fluorescein uptake.  Intraocular pressure 18 on the left  Neck:     Comments: No meningismus. Cardiovascular:     Rate and Rhythm: Normal rate and regular rhythm.     Heart sounds: Normal heart sounds. No murmur heard. Pulmonary:     Effort: Pulmonary effort is normal. No respiratory distress.     Breath sounds: Normal breath sounds.  Abdominal:     Palpations: Abdomen is soft.     Tenderness: There is no abdominal tenderness. There is no guarding or rebound.  Musculoskeletal:        General: No tenderness. Normal range of motion.     Cervical back: Normal range of motion and neck supple.  Skin:    General: Skin is warm.  Neurological:     Mental Status: She is alert and oriented to person, place, and time.     Cranial Nerves: No cranial nerve deficit.     Motor: No abnormal muscle tone.     Coordination: Coordination normal.     Comments: CN 2-12 intact, no ataxia on finger to nose, no nystagmus, 5/5 strength throughout, no pronator drift, Romberg negative, normal gait.   Psychiatric:        Behavior: Behavior normal.     ED Results / Procedures / Treatments   Labs (all labs ordered are listed, but only abnormal results are displayed) Labs Reviewed   COMPREHENSIVE METABOLIC PANEL - Abnormal; Notable for the following components:      Result Value   Glucose, Bld 105 (*)    All other components within normal limits  CBC WITH DIFFERENTIAL/PLATELET  PREGNANCY, URINE  TROPONIN I (HIGH SENSITIVITY)  TROPONIN I (HIGH SENSITIVITY)    EKG EKG Interpretation Date/Time:  Monday April 29 2023 23:05:44 EST Ventricular Rate:  68 PR Interval:  164 QRS Duration:  74 QT Interval:  432 QTC Calculation: 459 R Axis:   8  Text Interpretation: Normal sinus rhythm Normal ECG No previous ECGs available No previous  ECGs available Confirmed by Glynn Octave [phone]) on 04/30/2023 12:25:52 AM  Radiology CT ANGIO HEAD NECK W WO CM  Result Date: 04/30/2023 CLINICAL DATA:  Worsening headache, left ear and jaw, hypertension EXAM: CT ANGIOGRAPHY HEAD AND NECK WITH AND WITHOUT CONTRAST TECHNIQUE: Multidetector CT imaging of the head and neck was performed using the standard protocol during bolus administration of intravenous contrast. Multiplanar CT image reconstructions and MIPs were obtained to evaluate the vascular anatomy. Carotid stenosis measurements (when applicable) are obtained utilizing NASCET criteria, using the distal internal carotid diameter as the denominator. RADIATION DOSE REDUCTION: This exam was performed according to the departmental dose-optimization program which includes automated exposure control, adjustment of the mA and/or kV according to patient size and/or use of iterative reconstruction technique. CONTRAST:  75mL OMNIPAQUE IOHEXOL 350 MG/ML SOLN COMPARISON:  No prior CTA available, correlation is made with CT head 10/31/2018 FINDINGS: CT HEAD FINDINGS Brain: No evidence of acute infarct, hemorrhage, mass, mass effect, or midline shift. No hydrocephalus or extra-axial fluid collection. Partial empty sella. Normal craniocervical junction. Vascular: No hyperdense vessel. Skull: Negative for fracture or focal lesion. Sinuses/Orbits: No  acute finding. Other: The mastoid air cells are well aerated. CTA NECK FINDINGS Aortic arch: Two-vessel arch with a common origin of the brachiocephalic and left common carotid arteries. Imaged portion shows no evidence of aneurysm or dissection. No significant stenosis of the major arch vessel origins. Right carotid system: No evidence of stenosis, dissection, or occlusion. Left carotid system: No evidence of stenosis, dissection, or occlusion. Vertebral arteries: The right vertebral artery is hypoplastic throughout its course, with increased caliber in the distal right V4, likely via collaterals (series 605, images 1 cc 2-164). Left dominant system. No evidence of dissection, occlusion, or hemodynamically significant stenosis (greater than 50%). Skeleton: No acute osseous abnormality. Degenerative changes in the cervical spine. Other neck: No acute finding. Upper chest: No focal pulmonary opacity or pleural effusion. Review of the MIP images confirms the above findings CTA HEAD FINDINGS Anterior circulation: Both internal carotid arteries are patent to the termini, without significant stenosis. A1 segments patent. Normal anterior communicating artery. Anterior cerebral arteries are patent to their distal aspects without significant stenosis. No M1 stenosis or occlusion. MCA branches perfused to their distal aspects without significant stenosis. Posterior circulation: Vertebral arteries patent to the vertebrobasilar junction without significant stenosis. Posterior inferior cerebellar arteries patent proximally. Basilar patent to its distal aspect without significant stenosis. Superior cerebellar arteries patent proximally. Patent P1 segments. Near fetal origin of the left PCA from the left posterior communicating artery. PCAs perfused to their distal aspects without significant stenosis. The bilateral posterior communicating arteries are not visualized. Venous sinuses: As permitted by contrast timing, patent.  Narrowing of the distal transverse sinuses near the transverse-sigmoid junction. Anatomic variants: Near fetal origin of the left PCA. No evidence of aneurysm or vascular malformation. Review of the MIP images confirms the above findings IMPRESSION: 1. No acute intracranial process. 2. No intracranial large vessel occlusion or significant stenosis. 3. No hemodynamically significant stenosis in the neck. 4. Partial empty sella and narrowing of the distal transverse sinuses near the transverse-sigmoid junction, which can be seen in the setting of idiopathic intracranial hypertension. Electronically Signed   By: Wiliam Ke M.D.   On: 04/30/2023 02:35    Procedures Procedures    Medications Ordered in ED Medications  metoCLOPramide (REGLAN) injection 10 mg (has no administration in time range)  ketorolac (TORADOL) 30 MG/ML injection 15 mg (has no administration in time  range)  diphenhydrAMINE (BENADRYL) injection 25 mg (has no administration in time range)  fluorescein ophthalmic strip 1 strip (has no administration in time range)  tetracaine (PONTOCAINE) 0.5 % ophthalmic solution 2 drop (has no administration in time range)    ED Course/ Medical Decision Making/ A&P                                 Medical Decision Making Amount and/or Complexity of Data Reviewed Independent Historian: spouse Labs: ordered. Decision-making details documented in ED Course. Radiology: ordered and independent interpretation performed. Decision-making details documented in ED Course. ECG/medicine tests: ordered and independent interpretation performed. Decision-making details documented in ED Course.  Risk Prescription drug management.   Headache with elevated blood pressure, no history of the same.  Neurologically intact.  Will treat as potential migraine headache with Reglan, Benadryl and Toradol.  Obtain CT head as she has no history of similar headache.  Visual Acuity Bilateral Distance: 20/25 R  Distance: 20/25 L Distance: 20/25  CT head negative for hemorrhage.  CTA negative for aneurysm or large vessel occlusion.  Does show partially empty sella.  Labs reassuring.  Troponin negative x 2.  Low suspicion for ACS or PE.  Intraocular pressure is normal.  No evidence of glaucoma.  Headache has improved with IV Reglan, Toradol and Benadryl.  Blood pressure has normalized to 129/82.  Patient feels improved.  Do not suspect her partially empty sella is contributing to her headache today.  Low suspicion for acute idiopathic intracranial hypertension.  Patient sleeping comfortably on recheck.  Blood pressure has normalized. Neurological exam is nonfocal.  CT scan results discussed with her need for neurology follow-up.  Monitor blood pressure at home and follow up with PCP for further evaluation of blood pressure and need for medication.  Return to the ED with sudden onset headache, unilateral weakness, numbness, tingling, difficulty speaking, difficulty swallowing, chest pain, shortness of breath or other concerns.       Final Clinical Impression(s) / ED Diagnoses Final diagnoses:  Bad headache    Rx / DC Orders ED Discharge Orders     None         Brindy Higginbotham, Jeannett Senior, MD 04/30/23 [phone]

## 2023-04-30 NOTE — ED Notes (Signed)
Pt has returned from CT.  

## 2023-04-30 NOTE — ED Notes (Signed)
ED Provider at bedside. 

## 2023-04-30 NOTE — ED Notes (Signed)
Patient transported to CT 

## 2023-04-30 NOTE — Discharge Instructions (Addendum)
Your testing is reassuring.  CT head is negative for any bleeding or aneurysm.  Does show a Partial empty sella and narrowing of the distal transverse sinuses near the transverse-sigmoid junction, which can be seen inthe setting of idiopathic intracranial hypertension. This can be followed up with the neurologist.  We suspect this is a likely normal anatomical variation and not responsible for your headache today.  Monitor your blood pressure carefully at home.  Follow-up with your doctor for further adjustments of your blood pressure. Return to the ED with sudden onset headache, difficulty speaking, difficulty swallowing, unilateral weakness, numbness, tingling or any other concerns

## 2023-05-01 ENCOUNTER — Encounter: Payer: Self-pay | Admitting: Physician Assistant

## 2023-05-01 ENCOUNTER — Ambulatory Visit: Payer: Medicaid Other | Admitting: Physician Assistant

## 2023-05-01 ENCOUNTER — Other Ambulatory Visit: Payer: Self-pay | Admitting: Physician Assistant

## 2023-05-01 VITALS — BP 150/100 | HR 98 | Ht 64.0 in | Wt 278.0 lb

## 2023-05-01 DIAGNOSIS — E282 Polycystic ovarian syndrome: Secondary | ICD-10-CM

## 2023-05-01 DIAGNOSIS — R519 Headache, unspecified: Secondary | ICD-10-CM

## 2023-05-01 DIAGNOSIS — F418 Other specified anxiety disorders: Secondary | ICD-10-CM

## 2023-05-01 DIAGNOSIS — I10 Essential (primary) hypertension: Secondary | ICD-10-CM | POA: Diagnosis not present

## 2023-05-01 MED ORDER — BLOOD PRESSURE KIT: 1.0000 | PACK | Freq: Once | 0 refills | Status: AC

## 2023-05-01 MED ORDER — AMLODIPINE BESYLATE 2.5 MG PO TABS: 2.5000 mg | ORAL_TABLET | Freq: Every day | ORAL | 1 refills | Status: DC

## 2023-05-01 NOTE — Progress Notes (Signed)
Julia Schultz is a 42 y.o. female here for a hospital follow-up.  History of Present Illness:   Chief Complaint  Patient presents with   Follow-up    No further headaches    HPI  Headache Admitted to [facility] from 04/29/23 - 04/30/23 for headache with associated nausea, photophobia, eye pain. Went to bed normally on 04/28/23 and woke up the next morning with a headache. Blood pressure checked at [facility] was 183/113. This normalized to 125/85 in hospital. 04/29/23 CT angio head/neck w/wo contrast showed: 1) No acute intracranial process, 2) No intracranial large vessel occlusion or significant stenosis, 3) No hemodynamically significant stenosis in the neck, 4) Partial empty sella and narrowing of the distal transverse sinuses near the transverse-sigmoid junction, which can be seen in the setting of idiopathic intracranial hypertension.  Given Reglan, Toradol, Benadryl, Pontocaine. Currently she is having some temporal pressure.  Anxiety  She is having increased situational stress due to family issues. Has done talk therapy in the past. Would like to re-visit. Denies SI/HI.  PCOS Prescribed metformin 500 mg daily by GYN physician.  Plans to start this soon. HGBA1c at 6.2% on 04/24/23, up from 6.1% on 02/21/22.  Elevated Blood Pressure Blood pressure elevated in-office today at 130/94. Denies preeclampsia. Does not monitor blood pressure at home.   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   Anemia    Anxiety    Asthma    Complication of anesthesia    woke as a child during procedure   Constipation    Fatigue    Food allergy    GERD (gastroesophageal reflux disease)    Headache    migraines   Joint pain    Knee pain    Menorrhagia    Swelling of lower extremity    Thrombocytopenia (HCC) 2005   Vitamin D deficiency      Social History   Tobacco Use   Smoking status: Never   Smokeless tobacco: Never   Vaping Use   Vaping status: Never Used  Substance Use Topics   Alcohol use: Yes    Comment: occ   Drug use: No    Past Surgical History:  Procedure Laterality Date   BREAST SURGERY  2003   breast reduction    CESAREAN SECTION     x 3   CHOLECYSTECTOMY N/A 08/10/2015   Procedure: LAPAROSCOPIC CHOLECYSTECTOMY WITH INTRAOPERATIVE CHOLANGIOGRAM;  Surgeon: Manus Rudd, MD;  Location: [facility] OR;  Service: General;  Laterality: N/A;   COLPOSCOPY  2018   FOOT SURGERY Left child 4th grade   correct arch   TUBAL LIGATION Bilateral 2010    Family History  Problem Relation Age of Onset   Hyperlipidemia Mother    Hypertension Mother    Diabetes Mother    Diverticulitis Mother    Anxiety disorder Mother    Pancreatic cancer Father    Diabetes Father    High blood pressure Father    Depression Father     Allergies  Allergen Reactions   Banana Anaphylaxis    "lump in throat"   Latex Itching   Other     Walnuts cause itchy throat   Tomato Hives and Swelling    Current Medications:   Current Outpatient Medications:    Acetaminophen (TYLENOL 8 HOUR PO), Take by mouth., Disp: , Rfl:    diphenhydrAMINE (BENADRYL) 12.5 MG/5ML liquid, Take 12.5 mg by mouth daily  in the afternoon., Disp: , Rfl:    IBUPROFEN PO, Take by mouth., Disp: , Rfl:    albuterol (PROAIR HFA) 108 (90 Base) MCG/ACT inhaler, Inhale 2 puffs into the lungs every 6 (six) hours as needed for wheezing or shortness of breath. (Patient not taking: Reported on 02/21/2022), Disp: 1 each, Rfl: 0   metFORMIN (GLUCOPHAGE-XR) 500 MG 24 hr tablet, Take 1 tablet (500 mg total) by mouth daily with breakfast. (Patient not taking: Reported on 05/01/2023), Disp: 30 tablet, Rfl: 1   Vitamin D, Ergocalciferol, (DRISDOL) 1.25 MG (50000 UNIT) CAPS capsule, Take 1 capsule (50,000 Units total) by mouth every 7 (seven) days. (Patient not taking: Reported on 05/01/2023), Disp: 4 capsule, Rfl: 12   Review of Systems:   Review of Systems   Constitutional:  Negative for fever and malaise/fatigue.  HENT:  Negative for congestion.   Eyes:  Negative for blurred vision.  Respiratory:  Negative for cough and shortness of breath.   Cardiovascular:  Negative for chest pain, palpitations and leg swelling.  Gastrointestinal:  Negative for vomiting.  Musculoskeletal:  Positive for back pain (Lower).  Skin:  Negative for rash.  Neurological:  Negative for loss of consciousness and headaches.  Psychiatric/Behavioral:  The patient is nervous/anxious.     Vitals:   Vitals:   05/01/23 1116  BP: (!) 130/94  Pulse: 98  SpO2: 97%  Weight: 278 lb (126.1 kg)  Height: 5\' 4"  (1.626 m)     Body mass index is 47.72 kg/m.  Physical Exam:   Physical Exam Vitals and nursing note reviewed.  Constitutional:      General: She is not in acute distress.    Appearance: She is well-developed. She is not ill-appearing or toxic-appearing.  Cardiovascular:     Rate and Rhythm: Normal rate and regular rhythm.     Pulses: Normal pulses.     Heart sounds: Normal heart sounds, S1 normal and S2 normal.  Pulmonary:     Effort: Pulmonary effort is normal.     Breath sounds: Normal breath sounds.  Skin:    General: Skin is warm and dry.  Neurological:     Mental Status: She is alert.     GCS: GCS eye subscore is 4. GCS verbal subscore is 5. GCS motor subscore is 6.  Psychiatric:        Attention and Perception: Attention normal.        Mood and Affect: Mood is anxious. Affect is tearful.        Speech: Speech normal.        Behavior: Behavior normal. Behavior is cooperative.     Assessment and Plan:   Bad headache Symptoms overall resolved She does have neurology referral pending from emergency room for CT findings Provided info on idiopathic intracranial hypertension  We will also work on better stress management and hypertension control in the interim  Situational anxiety Uncontrolled Declines medication Talk therapy referral  placed I discussed with patient that if they develop any SI, to tell someone immediately and seek medical attention.  PCOS (polycystic ovarian syndrome) Labs recently drawn by gynecology She is planning to start metformin We may consider adding or changing to GLP-1 however she has concerns that this may worsen constipation Follow-up in 2 months   Essential hypertension Above goal today No evidence of end-organ damage on my exam Recommend patient monitor home blood pressure at least a few times weekly Start amlodipine 2.5 mg daily If home monitoring shows consistent elevation, or  any symptom(s) develop, recommend reach out to [place] for further advice on next steps Follow-up in 2 months, sooner if concerns  I,Alexander Ruley,acting as a scribe for [employer], PA.,have documented all relevant documentation on the behalf of Jarold Motto, PA,as directed by  Jarold Motto, PA while in the presence of Jarold Motto, [place].  I, Jarold Motto, [place], have reviewed all documentation for this visit. The documentation on 05/01/23 for the exam, diagnosis, procedures, and orders are all accurate and complete.   Jarold Motto, PA-C

## 2023-05-01 NOTE — Patient Instructions (Addendum)
It was great to see you!  Start Metformin 500 mg daily Let's follow-up in 2 months to check in on how you are tolerating this medication  Let's start amlodipine 2.5 mg daily Try to keep an eye on your blood pressure I will send in a blood pressure cuff -- please measure 2-3 times per week  Talk therapy referral placed  If you don't hear from neurology, let me know   Let's follow-up in 2 months, sooner if you have concerns.  Take care,  Jarold Motto PA-C

## 2023-05-02 ENCOUNTER — Ambulatory Visit: Payer: Medicaid Other | Admitting: Neurology

## 2023-05-02 ENCOUNTER — Encounter: Payer: Self-pay | Admitting: Neurology

## 2023-05-02 VITALS — BP 139/98 | HR 77 | Ht 64.0 in | Wt 277.6 lb

## 2023-05-02 DIAGNOSIS — R0683 Snoring: Secondary | ICD-10-CM

## 2023-05-02 DIAGNOSIS — Z6841 Body Mass Index (BMI) 40.0 and over, adult: Secondary | ICD-10-CM

## 2023-05-02 DIAGNOSIS — F419 Anxiety disorder, unspecified: Secondary | ICD-10-CM | POA: Diagnosis not present

## 2023-05-02 DIAGNOSIS — F439 Reaction to severe stress, unspecified: Secondary | ICD-10-CM | POA: Diagnosis not present

## 2023-05-02 DIAGNOSIS — I10 Essential (primary) hypertension: Secondary | ICD-10-CM | POA: Diagnosis not present

## 2023-05-02 DIAGNOSIS — R519 Headache, unspecified: Secondary | ICD-10-CM

## 2023-05-02 DIAGNOSIS — E236 Other disorders of pituitary gland: Secondary | ICD-10-CM

## 2023-05-02 DIAGNOSIS — R0681 Apnea, not elsewhere classified: Secondary | ICD-10-CM

## 2023-05-02 NOTE — Patient Instructions (Signed)
It was nice to meet you both today.  Here is what we discussed and my recommendations for you.  You may have a condition called pseudotumor cerebri, which means that there increased fluid pressure around your brain and results in pressure on your brain, which can cause headache, and pressure on the eye nerve(s), which can cause blurry vision, even loss of vision. We will consider a brain MRI in the future. Please make an appointment with your ophthalmologist for a full, dilated eye exam including visual field testing, to see if you have had any loss of peripheral vision or swelling of the eye nerve(s).  We may request a LP (lumbar puncture/spinal tap) with pressure testing and routine fluid testing. We will call you with the results. If your spinal fluid pressure is indeed elevated, we will have you start a medication called diamox to help keep your spinal fluid pressure at bay.  Some people need more than 1 spinal tap over time. Your vision and visual field can be affected. The most serious complication of having pseudotumor cerebri is loss of vision, which can be permanent. Work up, at least initially with the eye specialist should include: best corrected visual acuity, formal visual field testing, dilated fundus examination with optic disc photographs, and often optical coherence tomography (OCT) of the optic nerve, retinal nerve fiber layer, and macular ganglion cell layer. Worsening vision is an indication for intensifying treatment. Ultimately, some patients may need to be considered for a shunt in the future (to prevent fluid pressure from building up over and over).  We will also do a home sleep test to investigate, if you have obstructive sleep apnea.  If you have obstructive sleep apnea, I will want you to start treatment with a machine called CPAP or AutoPAP.  Treatment of obstructive sleep apnea can help headaches, also help with metabolism and weight loss, and ultimately, treatment of sleep apnea  can reduce risk for cardiovascular complications including heart disease and stroke risk. Follow up with your PCP for BP management, anxiety and stress management.

## 2023-05-02 NOTE — Progress Notes (Signed)
Subjective:    Patient ID: Julia Schultz is a 42 y.o. female.  HPI    Huston Foley, MD, PhD [facility] [address], [address] [address] [place], [place] [address]  I saw patient, Julia Schultz, as a referral from the emergency room for headache evaluation.  The patient is accompanied by her SO today.  Julia Schultz is a 42 year old female with an underlying medical history of PCOS, uncontrolled hypertension, vitamin D deficiency, anxiety and severe obesity with a BMI of over 45, who reports worsening headaches in the past few weeks and months.  She reports significant increase in her stress and anxiety.  She admits that she does not sleep well.  She has not started her blood pressure or prediabetes medication yet, she just received a prescription.  She does not go to bed at a set time, snores and has witnessed apneas per significant other.  She has never had a sleep study.  She was encouraged to pursue sleep testing in the past but never actually had a sleep test.  She has limited her caffeine intake.  She tries to hydrate well with water.  She has pressure behind her eyes.  Her last eye exam was with ophthalmology in March 2024, she has not made a follow-up appointment for recent worsening of eye pressure or floaters.  She has not had any double vision or loss of vision.  She has a pressure headache and sometimes wakes up with a headache.  She has nocturia about 1 time per average night.  Bedtime can range from 11 PM to as late as 4 AM.  Rise time varies.  She lives with her significant other, 16 year old son, 2 year old daughter and 51 year old son.  Her 32 year old son is on his own.  She is somewhat tearful today.  She works as a Engineer, technical sales for [employer].  She has had trouble losing weight.  She has gained weight in the recent past.  Of note, she presented to the emergency room on 04/29/2023 with a significant headache.  I reviewed the emergency room records.  She  had an elevated blood pressure at the time.  She was treated symptomatically with Reglan, Toradol, Benadryl.  She had a head CT without contrast and CT angio of the head and neck with and without contrast on 04/30/2023 and I reviewed the results:  IMPRESSION: 1. No acute intracranial process. 2. No intracranial large vessel occlusion or significant stenosis. 3. No hemodynamically significant stenosis in the neck. 4. Partial empty sella and narrowing of the distal transverse sinuses near the transverse-sigmoid junction, which can be seen in the setting of idiopathic intracranial hypertension.    In addition, I personally reviewed images through the PACS system.    She saw her primary care PA yesterday and I reviewed the office visit note.  Blood pressure was 150/100 at the time.  She was advised to start amlodipine 2.5 mg once daily.  She declined medication for anxiety.  She has seen ophthalmology in [place], records are not available for my review today.  She has not had a sleep study.  Epworth sleepiness score is 3 out of 24, fatigue severity score is 32 out of 63.  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   Anemia    Anxiety    Asthma    Complication of anesthesia  woke as a child during procedure   Constipation    Fatigue    Food allergy    GERD (gastroesophageal reflux disease)    Headache    migraines   Hypertension    Joint pain    Knee pain    Menorrhagia    Swelling of lower extremity    Thrombocytopenia (HCC) 2005   Vitamin D deficiency     Her Past Surgical History Is Significant For: Past Surgical History:  Procedure Laterality Date   BREAST SURGERY  2003   breast reduction    CESAREAN SECTION     x 3   CHOLECYSTECTOMY N/A 08/10/2015   Procedure: LAPAROSCOPIC CHOLECYSTECTOMY WITH INTRAOPERATIVE CHOLANGIOGRAM;  Surgeon: Manus Rudd, MD;  Location: [facility]  OR;  Service: General;  Laterality: N/A;   COLPOSCOPY  2018   FOOT SURGERY Left child 4th grade   correct arch   TUBAL LIGATION Bilateral 2010    Her Family History Is Significant For: Family History  Problem Relation Age of Onset   Hyperlipidemia Mother    Hypertension Mother    Diabetes Mother    Diverticulitis Mother    Anxiety disorder Mother    Pancreatic cancer Father    Diabetes Father    High blood pressure Father    Depression Father     Her Social History Is Significant For: Social History   Socioeconomic History   Marital status: Legally Separated    Spouse name: Not on file   Number of children: Not on file   Years of education: Not on file   Highest education level: Not on file  Occupational History   Not on file  Tobacco Use   Smoking status: Never   Smokeless tobacco: Never  Vaping Use   Vaping status: Never Used  Substance and Sexual Activity   Alcohol use: Yes    Comment: occ   Drug use: No   Sexual activity: Yes    Partners: Male    Birth control/protection: Surgical    Comment: BTL  Other Topics Concern   Not on file  Social History Narrative   Caffiene coffee 1 cup daily    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     Lives with fiance and 4 kids,  3 kids at home.    Social Determinants of Health   Financial Resource Strain: Not on file  Food Insecurity: Not on file  Transportation Needs: Not on file  Physical Activity: Not on file  Stress: Not on file  Social Connections: Unknown (10/15/2021)   Received from [facility], [facility]   Social Network    Social Network: Not on file    Her Allergies Are:  Allergies  Allergen Reactions   Banana Anaphylaxis    "lump in throat"   Latex Itching   Other     Walnuts cause itchy throat   Tomato Hives and Swelling  :   Her Current Medications Are:  Outpatient Encounter Medications as of 05/02/2023  Medication Sig    Acetaminophen (TYLENOL 8 HOUR PO) Take by mouth.   albuterol (PROAIR HFA) 108 (90 Base) MCG/ACT inhaler Inhale 2 puffs into the lungs every 6 (six) hours as needed for wheezing or shortness of breath.   diphenhydrAMINE (BENADRYL) 12.5 MG/5ML liquid Take 12.5 mg by mouth daily in the afternoon.   IBUPROFEN PO Take by mouth.   metFORMIN (GLUCOPHAGE-XR) 500 MG 24 hr tablet Take 1 tablet (  500 mg total) by mouth daily with breakfast.   Vitamin D, Ergocalciferol, (DRISDOL) 1.25 MG (50000 UNIT) CAPS capsule Take 1 capsule (50,000 Units total) by mouth every 7 (seven) days.   amLODipine (NORVASC) 2.5 MG tablet Take 1 tablet (2.5 mg total) by mouth daily. (Patient not taking: Reported on 05/02/2023)   No facility-administered encounter medications on file as of 05/02/2023.  :   Review of Systems:  Out of a complete 14 point review of systems, all are reviewed and negative with the exception of these symptoms as listed below:   Review of Systems  Neurological:        Here for Bad Headache.  Seen in ED.  Elevated Bp. Had CT.   ESS 3  FSS 32. Sometimes has felt pressures in eyes (spots floaters).  [facility] in [place].      Objective:  Neurological Exam  Physical Exam Physical Examination:   Vitals:   05/02/23 1526  BP: (!) 139/98  Pulse: 77    General Examination: The patient is a very pleasant 41 y.o. female in no acute distress. She appears well-developed and well-nourished and well groomed.   HEENT: Normocephalic, atraumatic, pupils are equal, round and reactive to light, corrective eyeglasses in place.  No photophobia.  Funduscopic exam benign, no obvious papilledema noted. Visual fields are full by finger perimetry.  Extraocular tracking is good without limitation to gaze excursion or nystagmus noted. Hearing is grossly intact. Face is symmetric with normal facial animation. Speech is clear with no dysarthria noted. There is no hypophonia. There is no lip, neck/head,  jaw or voice tremor. Neck is supple with full range of passive and active motion. There are no carotid bruits on auscultation. Oropharynx exam reveals: mild mouth dryness, good dental hygiene and moderate airway crowding, due to small airway entry, Mallampati class III.  Tonsils about 1+ bilaterally.  Neck circumference about 16 three-quarter inches.  Tongue protrudes centrally and palate elevates symmetrically.  Chest: Clear to auscultation without wheezing, rhonchi or crackles noted.  Heart: S1+S2+0, regular and normal without murmurs, rubs or gallops noted.   Abdomen: Soft, non-tender and non-distended.  Extremities: There is no pitting edema in the distal lower extremities bilaterally.   Skin: Warm and dry without trophic changes noted.   Musculoskeletal: exam reveals no obvious joint deformities with mild puffiness medial knees bilaterally.  She has bilateral knee pain.   Neurologically:  Mental status: The patient is awake, alert and oriented in all 4 spheres. Her immediate and remote memory, attention, language skills and fund of knowledge are appropriate. There is no evidence of aphasia, agnosia, apraxia or anomia. Speech is clear with normal prosody and enunciation. Thought process is linear. Mood is normal and affect is normal.  Cranial nerves II - XII are as described above under HEENT exam.  Motor exam: Normal bulk, strength and tone is noted. There is no obvious action or resting tremor.  There is no drift or rebound. Fine motor skills and coordination: intact finger taps, hand movements and rapid alternating patting with both upper extremities, normal foot taps bilaterally in the lower extremities.  Cerebellar testing: No dysmetria or intention tremor. There is no truncal or gait ataxia.  Normal finger-to-nose, normal heel-to-shin bilaterally. Sensory exam: intact to light touch in the upper and lower extremities.  Romberg is negative. Reflexes are 1+ in the upper extremities and  trace in the lower extremities, toes are downgoing bilaterally.   Gait, station and balance: She stands easily. No  veering to one side is noted. No leaning to one side is noted. Posture is age-appropriate and stance is narrow based. Gait shows normal stride length and normal pace. No problems turning are noted.  Normal tandem walk.  Assessment and Plan:  In summary, Julia Schultz is a very pleasant 42 y.o.-year old female with an underlying medical history of PCOS, uncontrolled hypertension, vitamin D deficiency, anxiety and severe obesity with a BMI of over 45, who presents for evaluation of her recurrent headaches.  Headache description is in keeping with a mixed headache including headaches from uncontrolled hypertension, stress headaches, sleep deprivation headaches, untreated underlying sleep disordered breathing not excluded, headaches from IIH also a possibility.  This was an extended visit of over 60 minutes with extended chart review involved, addressing multiple problems, explaining test results, explaining test procedures, and extensive counseling and coordination of care.  Below are my recommendations and a summary of our discussion.  The patient was given these instructions verbally and also in her electronic after visit summary: << You may have a condition called pseudotumor cerebri, which means that there increased fluid pressure around your brain and results in pressure on your brain, which can cause headache, and pressure on the eye nerve(s), which can cause blurry vision, even loss of vision. We will consider a brain MRI in the future. Please make an appointment with your ophthalmologist for a full, dilated eye exam including visual field testing, to see if you have had any loss of peripheral vision or swelling of the eye nerve(s).  We may request a LP (lumbar puncture/spinal tap) with pressure testing and routine fluid testing. We will call you with the results. If your spinal fluid pressure  is indeed elevated, we will have you start a medication called diamox to help keep your spinal fluid pressure at bay.  Some people need more than 1 spinal tap over time. Your vision and visual field can be affected. The most serious complication of having pseudotumor cerebri is loss of vision, which can be permanent. Work up, at least initially with the eye specialist should include: best corrected visual acuity, formal visual field testing, dilated fundus examination with optic disc photographs, and often optical coherence tomography (OCT) of the optic nerve, retinal nerve fiber layer, and macular ganglion cell layer. Worsening vision is an indication for intensifying treatment. Ultimately, some patients may need to be considered for a shunt in the future (to prevent fluid pressure from building up over and over).  We will also do a home sleep test to investigate, if you have obstructive sleep apnea.  If you have obstructive sleep apnea, I will want you to start treatment with a machine called CPAP or AutoPAP.  Treatment of obstructive sleep apnea can help headaches, also help with metabolism and weight loss, and ultimately, treatment of sleep apnea can reduce risk for cardiovascular complications including heart disease and stroke risk. Follow up with your PCP for BP management, anxiety and stress management.   >> I plan to see her back in the next 3 months.  We will keep her posted as to her home sleep test by phone call in the interim.    I answered all her questions today and the patient and her significant other were in agreement with our plan.    Huston Foley, MD, PhD

## 2023-05-06 ENCOUNTER — Telehealth: Payer: Self-pay | Admitting: Neurology

## 2023-05-06 NOTE — Telephone Encounter (Signed)
[facility]ST [facility] pending faxed notes

## 2023-05-06 NOTE — Telephone Encounter (Signed)
[facility] no auth req via fax form

## 2023-05-07 ENCOUNTER — Encounter: Payer: Self-pay | Admitting: Physician Assistant

## 2023-05-28 ENCOUNTER — Other Ambulatory Visit: Payer: Self-pay | Admitting: Physician Assistant

## 2023-05-28 ENCOUNTER — Other Ambulatory Visit (HOSPITAL_BASED_OUTPATIENT_CLINIC_OR_DEPARTMENT_OTHER): Payer: Self-pay | Admitting: Certified Nurse Midwife

## 2023-05-28 DIAGNOSIS — E282 Polycystic ovarian syndrome: Secondary | ICD-10-CM

## 2023-05-28 DIAGNOSIS — Z6841 Body Mass Index (BMI) 40.0 and over, adult: Secondary | ICD-10-CM

## 2023-05-28 IMAGING — MG MM DIGITAL SCREENING BILAT W/ TOMO AND CAD
6 of 10 series · 6 of 30 positions shown · non-contrast
Comparison: Previous exam(s).

CLINICAL DATA: Screening.

EXAM:
DIGITAL SCREENING BILATERAL MAMMOGRAM WITH TOMOSYNTHESIS AND CAD
TECHNIQUE: Bilateral screening digital craniocaudal and mediolateral oblique
mammograms were obtained. Bilateral screening digital breast
tomosynthesis was performed. The images were evaluated with
computer-aided detection.

[L MLO synth-2D (1 of 2)]
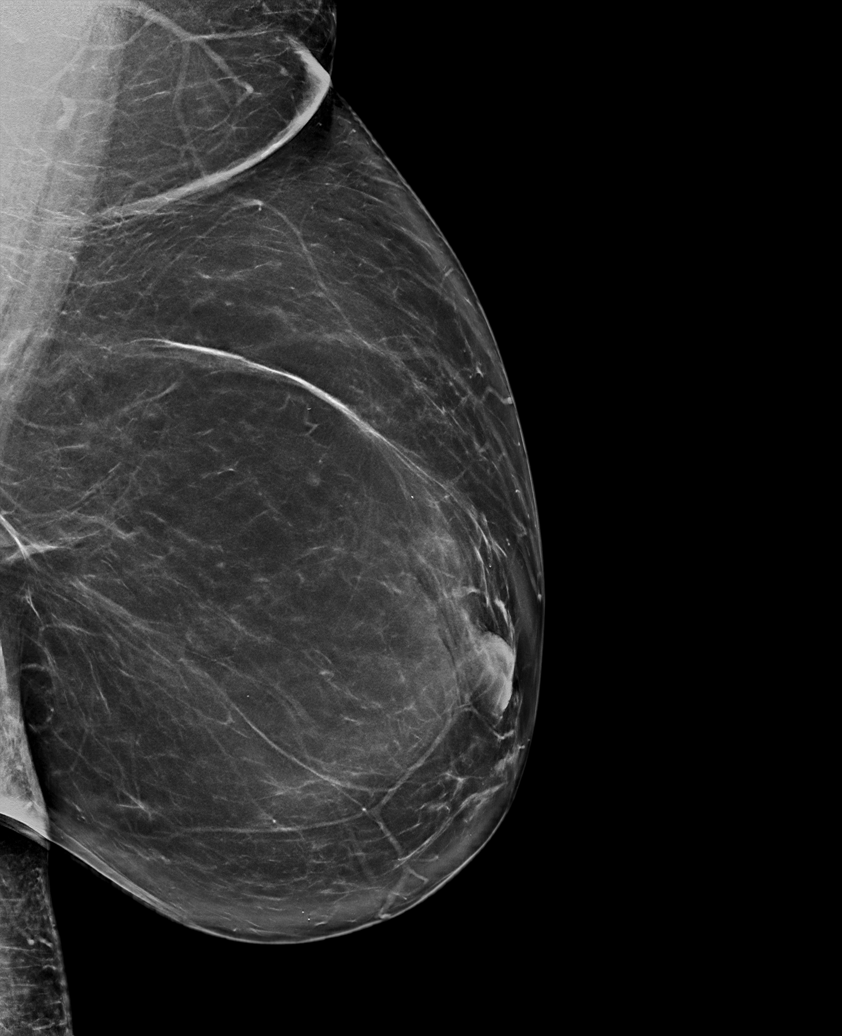

[L CC synth-2D]
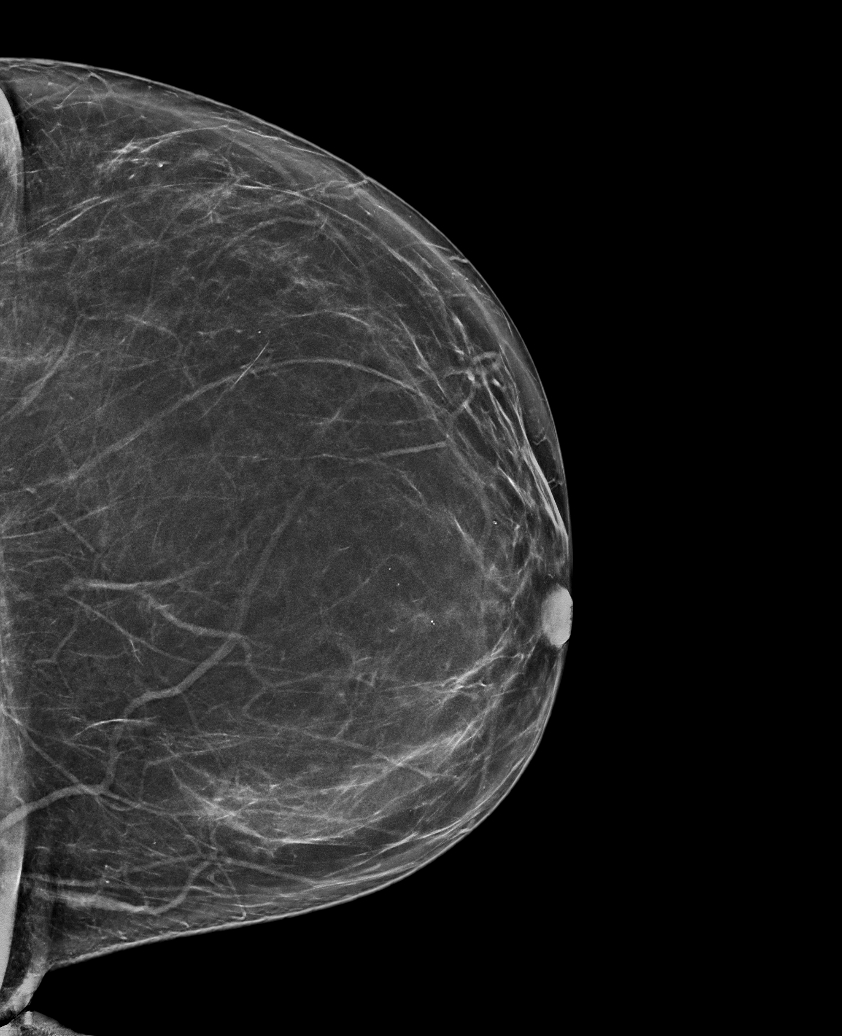

[L MLO synth-2D (2 of 2)]
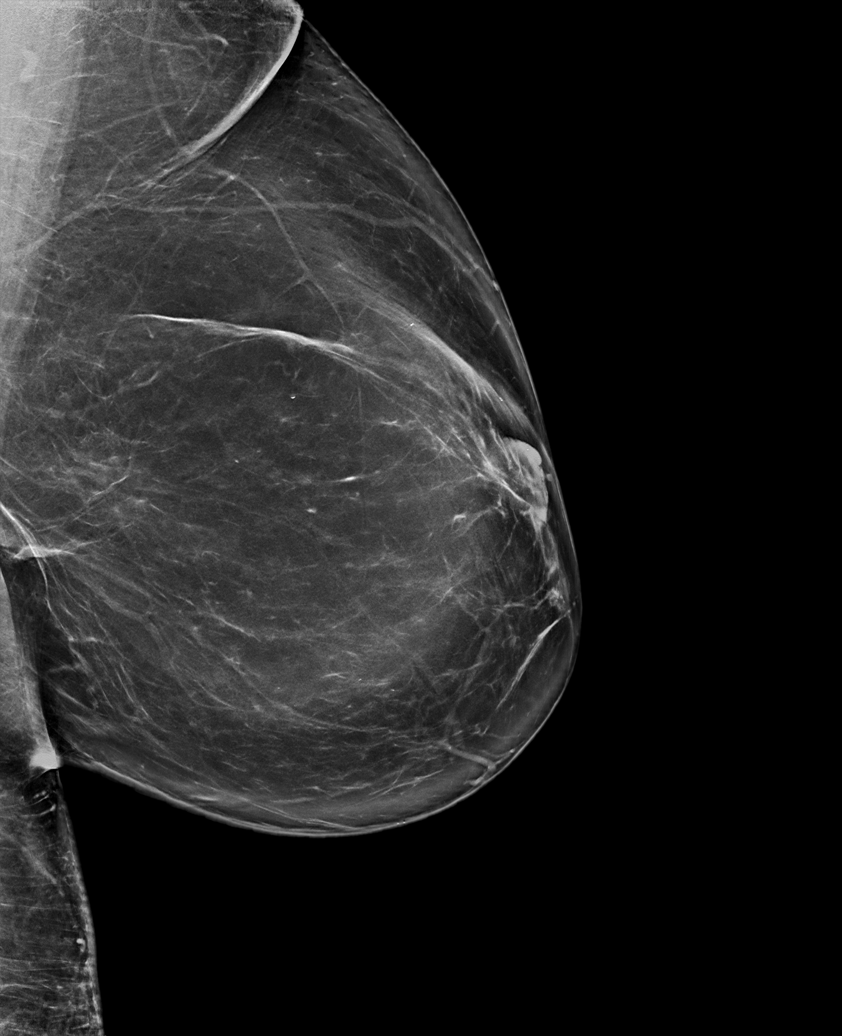

[R CC synth-2D]
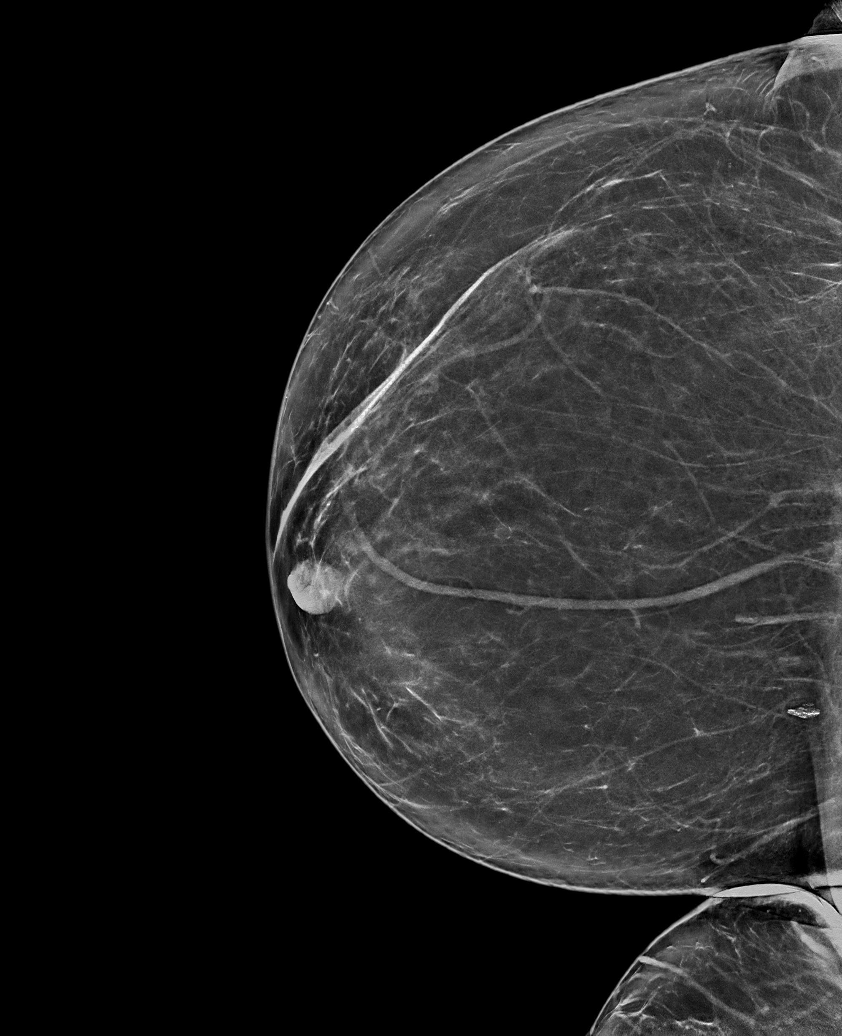

[R MLO synth-2D]
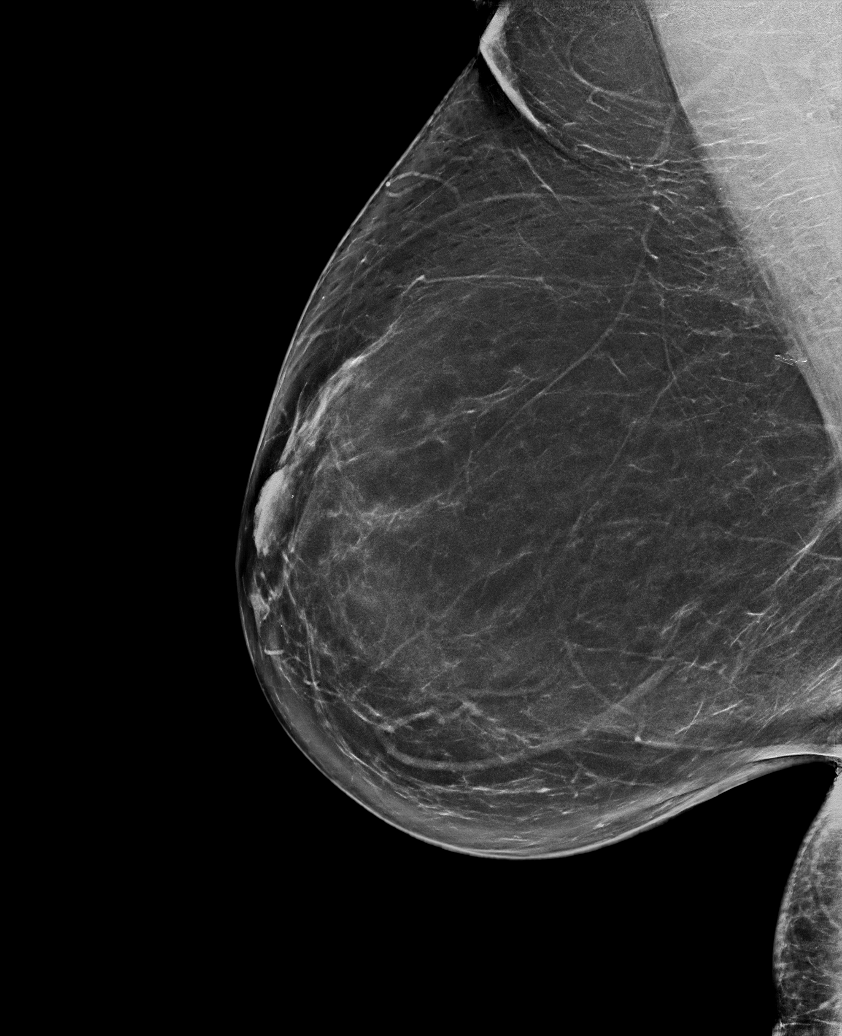

[L MLO tomo · tomo slice 45/90.0]
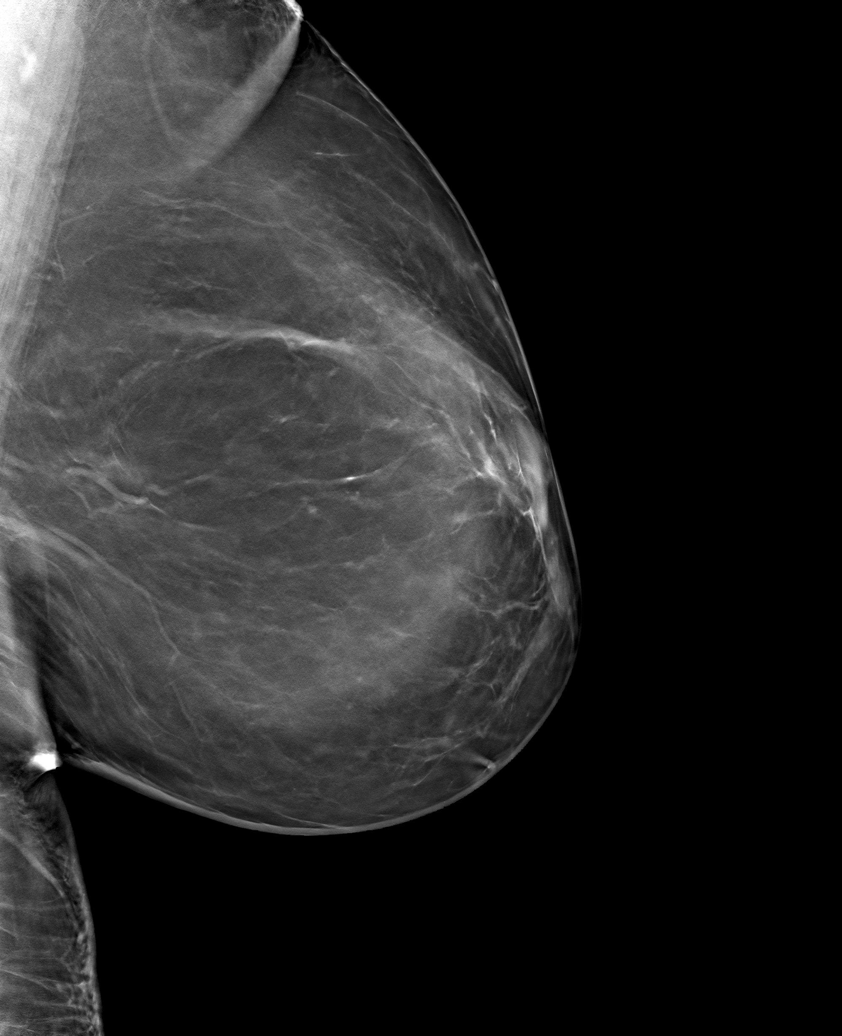

[6 of 30 positions shown; findings below may reference images not displayed]

ACR Breast Density Category b: There are scattered areas of
fibroglandular density.
FINDINGS: There are no findings suspicious for malignancy.
IMPRESSION: No mammographic evidence of malignancy. A result letter of this
screening mammogram will be mailed directly to the patient.

RECOMMENDATION:
Screening mammogram in one year. (Code:51-O-LD2)

BI-RADS CATEGORY  1: Negative.

## 2023-06-26 ENCOUNTER — Other Ambulatory Visit (HOSPITAL_BASED_OUTPATIENT_CLINIC_OR_DEPARTMENT_OTHER): Payer: Self-pay | Admitting: Certified Nurse Midwife

## 2023-07-01 NOTE — Progress Notes (Incomplete)
Subjective:    Julia Schultz is a 42 y.o. female and is here for a comprehensive physical exam.  HPI  There are no preventive care reminders to display for this patient.  Acute Concerns: ***  Chronic Issues: HTN  Reports compliance and good tolerance of Norvasc 2.5 mg once daily.   Anxiety   PCOS  Reports compliance and good tolerance of metformin 500 mg once daily.    Health Maintenance: Immunizations -- N/A Colonoscopy -- Last done on 08-08-16. Normal. 10 year recall. Mammogram -- last done on 07-18-21. Normal.  PAP -- last done on 04-18-23. HPV positive  Bone Density -- N/A Diet -- *** Exercise -- ***  Sleep habits -- *** Mood -- ***  UTD with dentist? - *** UTD with eye doctor? - ***  Weight history: Wt Readings from Last 10 Encounters:  05/02/23 277 lb 9.6 oz (125.9 kg)  05/01/23 278 lb (126.1 kg)  04/29/23 273 lb (123.8 kg)  04/24/23 276 lb (125.2 kg)  04/18/23 277 lb 12.8 oz (126 kg)  05/08/22 253 lb (114.8 kg)  04/04/22 252 lb (114.3 kg)  03/21/22 250 lb (113.4 kg)  03/07/22 251 lb (113.9 kg)  02/21/22 256 lb (116.1 kg)   There is no height or weight on file to calculate BMI. No LMP recorded.  Alcohol use:  reports current alcohol use.  Tobacco use:  Tobacco Use: Low Risk  (05/01/2023)   Patient History    Smoking Tobacco Use: Never    Smokeless Tobacco Use: Never    Passive Exposure: Not on file   Eligible for lung cancer screening? ***     05/01/2023   11:20 AM  Depression screen PHQ 2/9  Decreased Interest 1  Down, Depressed, Hopeless 0  PHQ - 2 Score 1  Altered sleeping 1  Tired, decreased energy 1  Change in appetite 0  Feeling bad or failure about yourself  0  Trouble concentrating 0  Moving slowly or fidgety/restless 0  Suicidal thoughts 0  PHQ-9 Score 3  Difficult doing work/chores Somewhat difficult     Other providers/specialists: Patient Care Team: Jarold Motto, [place] as PCP - General (Physician Assistant) Jerene Bears, MD as Consulting Physician (Gynecology)    PMHx, SurgHx, SocialHx, Medications, and Allergies were reviewed in the Visit Navigator and updated as appropriate.   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   Anemia    Anxiety    Asthma    Complication of anesthesia    woke as a child during procedure   Constipation    Fatigue    Food allergy    GERD (gastroesophageal reflux disease)    Headache    migraines   Hypertension    Joint pain    Knee pain    Menorrhagia    Swelling of lower extremity    Thrombocytopenia (HCC) 2005   Vitamin D deficiency      Past Surgical History:  Procedure Laterality Date   BREAST SURGERY  2003   breast reduction    CESAREAN SECTION     x 3   CHOLECYSTECTOMY N/A 08/10/2015   Procedure: LAPAROSCOPIC CHOLECYSTECTOMY WITH INTRAOPERATIVE CHOLANGIOGRAM;  Surgeon: Manus Rudd, MD;  Location: [facility] OR;  Service: General;  Laterality: N/A;   COLPOSCOPY  2018   FOOT SURGERY Left child 4th grade   correct arch   TUBAL LIGATION Bilateral 2010     Family  History  Problem Relation Age of Onset   Hyperlipidemia Mother    Hypertension Mother    Diabetes Mother    Diverticulitis Mother    Anxiety disorder Mother    Pancreatic cancer Father    Diabetes Father    High blood pressure Father    Depression Father     Social History   Tobacco Use   Smoking status: Never   Smokeless tobacco: Never  Vaping Use   Vaping status: Never Used  Substance Use Topics   Alcohol use: Yes    Comment: occ   Drug use: No    Review of Systems:   ROS  Objective:   There were no vitals taken for this visit. There is no height or weight on file to calculate BMI.   General Appearance:    Alert, cooperative, no distress, appears stated age  Head:    Normocephalic, without obvious abnormality, atraumatic  Eyes:    PERRL, conjunctiva/corneas clear, EOM's intact, fundi    benign, both eyes   Ears:    Normal TM's and external ear canals, both ears  Nose:   Nares normal, septum midline, mucosa normal, no drainage    or sinus tenderness  Throat:   Lips, mucosa, and tongue normal; teeth and gums normal  Neck:   Supple, symmetrical, trachea midline, no adenopathy;    thyroid:  no enlargement/tenderness/nodules; no carotid   bruit or JVD  Back:     Symmetric, no curvature, ROM normal, no CVA tenderness  Lungs:     Clear to auscultation bilaterally, respirations unlabored  Chest Wall:    No tenderness or deformity   Heart:    Regular rate and rhythm, S1 and S2 normal, no murmur, rub or gallop  Breast Exam:    ***No tenderness, masses, or nipple abnormality  Abdomen:     Soft, non-tender, bowel sounds active all four quadrants,    no masses, no organomegaly  Genitalia:    ***Normal female without lesion, discharge or tenderness  Extremities:   Extremities normal, atraumatic, no cyanosis or edema  Pulses:   2+ and symmetric all extremities  Skin:   Skin color, texture, turgor normal, no rashes or lesions  Lymph nodes:   Cervical, supraclavicular, and axillary nodes normal  Neurologic:   CNII-XII intact, normal strength, sensation and reflexes    throughout    Assessment/Plan:   There are no diagnoses linked to this encounter.   Jarold Motto, PA-C  [place]   I,Safa M Kadhim,acting as a Neurosurgeon for [employer], PA.,have documented all relevant documentation on the behalf of Jarold Motto, PA,as directed by  Jarold Motto, PA while in the presence of Jarold Motto, [place].   I, Jarold Motto, [place], have reviewed all documentation for this visit. The documentation on 07/01/23 for the exam, diagnosis, procedures, and orders are all accurate and complete.

## 2023-07-02 ENCOUNTER — Encounter: Payer: Medicaid Other | Admitting: Physician Assistant

## 2023-07-03 ENCOUNTER — Telehealth: Payer: Self-pay

## 2023-07-03 NOTE — Telephone Encounter (Signed)
Patient has been called twice (05/13/23 & 07/03/23) to schedule HST that was ordered by Dr Frances Furbish. Pt has been unavailable and we left vm both times.

## 2023-07-31 ENCOUNTER — Ambulatory Visit: Payer: Medicaid Other | Admitting: Neurology

## 2024-04-20 ENCOUNTER — Ambulatory Visit (HOSPITAL_BASED_OUTPATIENT_CLINIC_OR_DEPARTMENT_OTHER): Payer: Medicaid Other | Admitting: Obstetrics & Gynecology

## 2024-04-20 NOTE — Progress Notes (Deleted)
 ANNUAL EXAM Patient name: Julia Schultz MRN [id number]  Date of birth: August 20, 1980 Chief Complaint:   No chief complaint on file.  History of Present Illness:   Julia Schultz is a 43 y.o. [phone] African-American female being seen today for a routine annual exam.  Current complaints: ***  No LMP recorded.   The pregnancy intention screening data noted above was reviewed. Potential methods of contraception were discussed. The patient elected to proceed with No data recorded.   Last pap 04/18/2023. Results were: NILM w/ HRHPV positive: type not specified. H/O abnormal pap: {yes/yes***/no:23866} Last mammogram: 07/18/2021. Results were: normal. Family h/o breast cancer: {yes***/no:23838} Last colonoscopy: ***. Results were: {normal, abnormal, n/a:23837}. Family h/o colorectal cancer: {yes***/no:23838}     05/01/2023   11:20 AM 04/18/2023   10:58 AM 02/21/2022    8:28 AM 06/29/2021    2:44 PM 11/11/2018   10:09 AM  Depression screen PHQ 2/9  Decreased Interest 1 0 3 0 1  Down, Depressed, Hopeless 0 0 1 0 1  PHQ - 2 Score 1 0 4 0 2  Altered sleeping 1  0  2  Tired, decreased energy 1  1  3   Change in appetite 0  1  1  Feeling bad or failure about yourself  0  1  0  Trouble concentrating 0  0  1  Moving slowly or fidgety/restless 0  0  2  Suicidal thoughts 0  0  0  PHQ-9 Score 3   7   11    Difficult doing work/chores Somewhat difficult  Not difficult at all  Somewhat difficult     Data saved with a previous flowsheet row definition        05/01/2023   11:20 AM 11/11/2018   10:10 AM  GAD 7 : Generalized Anxiety Score  Nervous, Anxious, on Edge 3 2  Control/stop worrying 3 0  Worry too much - different things 2 0  Trouble relaxing 2 2  Restless 2 1  Easily annoyed or irritable 2 1  Afraid - awful might happen 2 0  Total GAD 7 Score 16 6  Anxiety Difficulty Somewhat difficult Somewhat difficult     Review of Systems:   Pertinent items are noted in HPI Denies any headaches,  blurred vision, fatigue, shortness of breath, chest pain, abdominal pain, abnormal vaginal discharge/itching/odor/irritation, problems with periods, bowel movements, urination, or intercourse unless otherwise stated above. Pertinent History Reviewed:  Reviewed past medical,surgical, social and family history.  Reviewed problem list, medications and allergies. Physical Assessment:  There were no vitals filed for this visit.There is no height or weight on file to calculate BMI.        Physical Examination:   General appearance - well appearing, and in no distress  Mental status - alert, oriented to person, place, and time  Psych:  She has a normal mood and affect  Skin - warm and dry, normal color, no suspicious lesions noted  Chest - effort normal, all lung fields clear to auscultation bilaterally  Heart - normal rate and regular rhythm  Neck:  midline trachea, no thyromegaly or nodules  Breasts - breasts appear normal, no suspicious masses, no skin or nipple changes or  axillary nodes  Abdomen - soft, nontender, nondistended, no masses or organomegaly  Pelvic - VULVA: normal appearing vulva with no masses, tenderness or lesions  VAGINA: normal appearing vagina with normal color and discharge, no lesions  CERVIX: normal appearing cervix without discharge or lesions, no CMT  Thin prep pap is {Desc; done/not:10129} *** HR HPV cotesting  UTERUS: uterus is felt to be normal size, shape, consistency and nontender   ADNEXA: No adnexal masses or tenderness noted.  Rectal - normal rectal, good sphincter tone, no masses felt. Hemoccult: ***  Extremities:  No swelling or varicosities noted  Chaperone present for exam  No results found for this or any previous visit (from the past 24 hours).  Assessment & Plan:  1) Well-Woman Exam  2) ***  Labs/procedures today: ***  Mammogram: {Mammo f/u:25212::@ 43yo}, or sooner if problems Colonoscopy: {TCS f/u:25213::@ 43yo}, or sooner if  problems  No orders of the defined types were placed in this encounter.   Meds: No orders of the defined types were placed in this encounter.   Follow-up: No follow-ups on file.  Julia LOISE Quale, RN 04/20/2024 8:22 AM

## 2024-05-19 ENCOUNTER — Encounter (HOSPITAL_BASED_OUTPATIENT_CLINIC_OR_DEPARTMENT_OTHER): Payer: Self-pay | Admitting: Obstetrics & Gynecology

## 2024-05-19 ENCOUNTER — Other Ambulatory Visit (HOSPITAL_COMMUNITY)
Admission: RE | Admit: 2024-05-19 | Discharge: 2024-05-19 | Disposition: A | Discharge status: Home / Self Care | Source: Ambulatory Visit | Attending: Obstetrics & Gynecology | Admitting: Obstetrics & Gynecology

## 2024-05-19 ENCOUNTER — Ambulatory Visit (INDEPENDENT_AMBULATORY_CARE_PROVIDER_SITE_OTHER): Admitting: Obstetrics & Gynecology

## 2024-05-19 VITALS — BP 151/95 | HR 81 | Ht 64.0 in | Wt 272.6 lb

## 2024-05-19 DIAGNOSIS — E66813 Obesity, class 3: Secondary | ICD-10-CM | POA: Diagnosis not present

## 2024-05-19 DIAGNOSIS — Z6841 Body Mass Index (BMI) 40.0 and over, adult: Secondary | ICD-10-CM | POA: Diagnosis not present

## 2024-05-19 DIAGNOSIS — Z01419 Encounter for gynecological examination (general) (routine) without abnormal findings: Secondary | ICD-10-CM | POA: Diagnosis not present

## 2024-05-19 DIAGNOSIS — N912 Amenorrhea, unspecified: Secondary | ICD-10-CM | POA: Diagnosis not present

## 2024-05-19 DIAGNOSIS — R87810 Cervical high risk human papillomavirus (HPV) DNA test positive: Secondary | ICD-10-CM

## 2024-05-19 DIAGNOSIS — Z1331 Encounter for screening for depression: Secondary | ICD-10-CM | POA: Diagnosis not present

## 2024-05-19 DIAGNOSIS — R7303 Prediabetes: Secondary | ICD-10-CM

## 2024-05-19 DIAGNOSIS — Z1231 Encounter for screening mammogram for malignant neoplasm of breast: Secondary | ICD-10-CM | POA: Diagnosis not present

## 2024-05-19 NOTE — Progress Notes (Signed)
 ANNUAL EXAM Patient name: Julia Schultz MRN [id number]  Date of birth: 08/10/1980 Chief Complaint:   No chief complaint on file.  History of Present Illness:   Julia Schultz is a 43 y.o. [phone] African-American female being seen today for a routine annual exam.  Has stopped taking her blood pressure medication.  She feels like she really needs to lose weight.  Discussed importance of managing blood pressure.    Discussed with pt breast cancer screening.  She asks about breast ultrasound only.  Discussed difference in mammogram vs ultrasound for breast cancer screening.  Willing to update mammogram this year.  Order placed.    No LMP recorded (lmp unknown).   Last pap 04/18/2023. Results were: +HR HPV 04/2023 Last mammogram: 07/18/2021. Results were: normal. Family h/o breast cancer: no Last colonoscopy: not indicated yet.       05/19/2024   11:16 AM 05/01/2023   11:20 AM 04/18/2023   10:58 AM 02/21/2022    8:28 AM 06/29/2021    2:44 PM  Depression screen PHQ 2/9  Decreased Interest 0 1 0 3 0  Down, Depressed, Hopeless 0 0 0 1 0  PHQ - 2 Score 0 1 0 4 0  Altered sleeping 1 1  0   Tired, decreased energy 1 1  1    Change in appetite 1 0  1   Feeling bad or failure about yourself  0 0  1   Trouble concentrating 0 0  0   Moving slowly or fidgety/restless 1 0  0   Suicidal thoughts 0 0  0   PHQ-9 Score 4 3   7     Difficult doing work/chores Not difficult at all Somewhat difficult  Not difficult at all      Data saved with a previous flowsheet row definition        05/19/2024   11:15 AM 05/01/2023   11:20 AM 11/11/2018   10:10 AM  GAD 7 : Generalized Anxiety Score  Nervous, Anxious, on Edge 1 3 2   Control/stop worrying 0 3 0  Worry too much - different things 0 2 0  Trouble relaxing 1 2 2   Restless 0 2 1  Easily annoyed or irritable 1 2 1   Afraid - awful might happen 0 2 0  Total GAD 7 Score 3 16 6   Anxiety Difficulty Not difficult at all Somewhat difficult Somewhat difficult      Review of Systems:   Pertinent items are noted in HPI Denies any headaches, blurred vision, fatigue, shortness of breath, chest pain, abdominal pain, abnormal vaginal discharge/itching/odor/irritation, problems with periods, bowel movements, urination, or intercourse unless otherwise stated above. Pertinent History Reviewed:  Reviewed past medical,surgical, social and family history.  Reviewed problem list, medications and allergies. Physical Assessment:   Vitals:   05/19/24 1057  BP: (!) 151/95  Pulse: 81  SpO2: 100%  Weight: 272 lb 9.6 oz (123.7 kg)  Height: 5' 4 (1.626 m)  Body mass index is 46.79 kg/m.        Physical Examination:   General appearance - well appearing, and in no distress  Mental status - alert, oriented to person, place, and time  Psych:  She has a normal mood and affect  Skin - warm and dry, normal color, no suspicious lesions noted  Chest - effort normal, all lung fields clear to auscultation bilaterally  Heart - normal rate and regular rhythm  Neck:  midline trachea, no thyromegaly or nodules  Breasts - breasts appear  normal, no suspicious masses, no skin or nipple changes or  axillary nodes  Abdomen - soft, nontender, nondistended, no masses or organomegaly  Pelvic - VULVA: normal appearing vulva with no masses, tenderness or lesions   VAGINA: normal appearing vagina with normal color and discharge, no lesions   CERVIX: normal appearing cervix without discharge or lesions, no CMT  Thin prep pap is updated today with HR HPV  UTERUS: uterus is felt to be normal size, shape, consistency and nontender   ADNEXA: No adnexal masses or tenderness noted.  Rectal - normal rectal, good sphincter tone, no masses felt  Extremities:  No swelling or varicosities noted  Chaperone present for exam  No results found for this or any previous visit (from the past 24 hours).  Assessment & Plan:  1. Well woman exam with routine gynecological exam (Primary) - Pap  smear with HR HPV obtained today - Mammogram discussed.  Order placed.   - Colonoscopy guidelines discussed.  Will plan to have this done around age 54. - lab work done as per belowe.   - vaccines reviewed/updated  2. 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 Encounter for screening mammogram for malignant neoplasm of breast - MM 3D SCREENING MAMMOGRAM BILATERAL BREAST; Future  4. Amenorrhea - Prolactin - TSH - Follicle stimulating hormone  5. Prediabetes - Hemoglobin A1c - Lipid panel - Comprehensive metabolic panel with GFR  6. Obesity, Class III, BMI 40-49.9 (morbid obesity) (HCC)   No orders of the defined types were placed in this encounter.   Meds: No orders of the defined types were placed in this encounter.   Follow-up: No follow-ups on file.  Ronal GORMAN Pinal, MD 05/19/2024 11:20 AM

## 2024-05-20 LAB — FOLLICLE STIMULATING HORMONE: FSH: 41.2 m[IU]/mL

## 2024-05-20 LAB — COMPREHENSIVE METABOLIC PANEL WITH GFR
ALT: 20 IU/L (ref 0–32)
AST: 20 IU/L (ref 0–40)
Albumin: 4.3 g/dL (ref 3.9–4.9)
Alkaline Phosphatase: 78 IU/L (ref 41–116)
BUN/Creatinine Ratio: 14 (ref 9–23)
BUN: 13 mg/dL (ref 6–24)
Bilirubin Total: 0.3 mg/dL (ref 0.0–1.2)
CO2: 25 mmol/L (ref 20–29)
Calcium: 9.4 mg/dL (ref 8.7–10.2)
Chloride: 103 mmol/L (ref 96–106)
Creatinine, Ser: 0.92 mg/dL (ref 0.57–1.00)
Globulin, Total: 2.4 g/dL (ref 1.5–4.5)
Glucose: 108 mg/dL — ABNORMAL HIGH (ref 70–99)
Potassium: 4.2 mmol/L (ref 3.5–5.2)
Sodium: 140 mmol/L (ref 134–144)
Total Protein: 6.7 g/dL (ref 6.0–8.5)
eGFR: 79 mL/min/1.73 (ref >59)

## 2024-05-20 LAB — LIPID PANEL
Chol/HDL Ratio: 4.5 ratio — ABNORMAL HIGH (ref 0.0–4.4)
Cholesterol, Total: 230 mg/dL — ABNORMAL HIGH (ref 100–199)
HDL: 51 mg/dL (ref >39)
LDL Chol Calc (NIH): 164 mg/dL — ABNORMAL HIGH (ref 0–99)
Triglycerides: 84 mg/dL (ref 0–149)
VLDL Cholesterol Cal: 15 mg/dL (ref 5–40)

## 2024-05-20 LAB — PROLACTIN: Prolactin: 6.3 ng/mL (ref 4.8–33.4)

## 2024-05-20 LAB — TSH: TSH: 3.04 u[IU]/mL (ref 0.450–4.500)

## 2024-05-20 LAB — HEMOGLOBIN A1C
Est. average glucose Bld gHb Est-mCnc: 134 mg/dL
Hgb A1c MFr Bld: 6.3 % — ABNORMAL HIGH (ref 4.8–5.6)

## 2024-05-25 LAB — CYTOLOGY - PAP
Adequacy: ABSENT
Comment: NEGATIVE
Diagnosis: NEGATIVE
High risk HPV: NEGATIVE

## 2024-06-02 ENCOUNTER — Ambulatory Visit (HOSPITAL_BASED_OUTPATIENT_CLINIC_OR_DEPARTMENT_OTHER): Payer: Self-pay | Admitting: Obstetrics & Gynecology

## 2024-06-08 NOTE — Progress Notes (Signed)
 LMOM at 8:46 for patient to call office to receive lab results and recommendation. tbw

## 2024-06-19 ENCOUNTER — Other Ambulatory Visit (HOSPITAL_BASED_OUTPATIENT_CLINIC_OR_DEPARTMENT_OTHER): Payer: Self-pay | Admitting: Obstetrics & Gynecology

## 2024-06-23 ENCOUNTER — Ambulatory Visit (INDEPENDENT_AMBULATORY_CARE_PROVIDER_SITE_OTHER): Admitting: Physician Assistant

## 2024-06-23 ENCOUNTER — Encounter: Payer: Self-pay | Admitting: Physician Assistant

## 2024-06-23 VITALS — BP 138/90 | HR 84 | Temp 97.4°F | Ht 64.0 in | Wt 267.0 lb

## 2024-06-23 DIAGNOSIS — E669 Obesity, unspecified: Secondary | ICD-10-CM | POA: Diagnosis not present

## 2024-06-23 DIAGNOSIS — Z0000 Encounter for general adult medical examination without abnormal findings: Secondary | ICD-10-CM | POA: Diagnosis not present

## 2024-06-23 DIAGNOSIS — K5900 Constipation, unspecified: Secondary | ICD-10-CM

## 2024-06-23 DIAGNOSIS — I10 Essential (primary) hypertension: Secondary | ICD-10-CM

## 2024-06-23 DIAGNOSIS — R7303 Prediabetes: Secondary | ICD-10-CM | POA: Diagnosis not present

## 2024-06-23 MED ORDER — POLYETHYLENE GLYCOL 3350 17 GM/SCOOP PO POWD: 17.0000 g | Freq: Two times a day (BID) | ORAL | 1 refills | Status: AC | PRN

## 2024-06-23 MED ORDER — WEGOVY 0.25 MG/0.5ML ~~LOC~~ SOAJ: 0.2500 mg | SUBCUTANEOUS | 1 refills | Status: AC

## 2024-06-23 MED ORDER — AMLODIPINE BESYLATE 5 MG PO TABS: 5.0000 mg | ORAL_TABLET | Freq: Every day | ORAL | 1 refills | Status: AC

## 2024-06-23 NOTE — Patient Instructions (Signed)
 It was great to see you!  Look into [facility] -- they accept Medicaid and do offer quality talk therapy  Start amlodipine  5 mg daily  Start Wegovy  0.25 mg weekly  Let's follow-up in 3 months, sooner if you have concerns.  Take care,  Lucie Buttner PA-C
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
## 2024-06-23 NOTE — Telephone Encounter (Signed)
 Dr.Miller,  Patient called to check in on recommendations. Please advise.

## 2024-07-02 ENCOUNTER — Other Ambulatory Visit (HOSPITAL_COMMUNITY): Payer: Self-pay

## 2024-07-02 ENCOUNTER — Telehealth: Payer: Self-pay

## 2024-07-02 NOTE — Telephone Encounter (Signed)
 Pharmacy Patient Advocate Encounter   Received notification from CoverMyMeds that prior authorization for Wegovy  0.25mg /0.44ml is required/requested.   Insurance verification completed.   The patient is insured through [facility] MEDICAID.   Per test claim: PA required; PA submitted to above mentioned insurance via Latent Key/confirmation #/EOC [id number] Status is pending

## 2024-07-03 NOTE — Telephone Encounter (Signed)
 Pharmacy Patient Advocate Encounter  Received notification from [facility] MEDICAID that Prior Authorization for Wegovy  0.25mg /0.86ml has been DENIED.  See denial reason below. No denial letter attached in CMM. Will attach denial letter to Media tab once received.   PA #/Case ID/Reference #: [id number]

## 2024-07-06 NOTE — Telephone Encounter (Signed)
 Natalie, the requirements they listed should have been answered when you did the referral. Please resubmit and answer the questions and submit office notes. Thanks

## 2024-07-07 NOTE — Telephone Encounter (Signed)
 Julia Schultz, Wegovy  has been denied. Please see message from PA Team.

## 2024-07-08 NOTE — Telephone Encounter (Signed)
 Left message on voicemail I will send a My Chart message if any questions can call back.

## 2024-08-26 ENCOUNTER — Ambulatory Visit (HOSPITAL_BASED_OUTPATIENT_CLINIC_OR_DEPARTMENT_OTHER): Admitting: Obstetrics & Gynecology

## 2024-09-22 ENCOUNTER — Ambulatory Visit: Admitting: Physician Assistant

## 2025-06-29 ENCOUNTER — Encounter: Admitting: Physician Assistant
